# Patient Record
Sex: Male | Born: 1980 | Race: Black or African American | Hispanic: No | Marital: Married | Smoking: Former smoker
Health system: Southern US, Community
[De-identification: ages and names within clinical notes are randomized; demographics above are authoritative.]

## PROBLEM LIST (undated history)

## (undated) DIAGNOSIS — E538 Deficiency of other specified B group vitamins: Secondary | ICD-10-CM

## (undated) DIAGNOSIS — E559 Vitamin D deficiency, unspecified: Secondary | ICD-10-CM

## (undated) HISTORY — DX: Deficiency of other specified B group vitamins: E53.8

## (undated) HISTORY — DX: Vitamin D deficiency, unspecified: E55.9

---

## 2013-07-09 DIAGNOSIS — R7402 Elevation of levels of lactic acid dehydrogenase (LDH): Secondary | ICD-10-CM | POA: Insufficient documentation

## 2013-07-09 DIAGNOSIS — Z79899 Other long term (current) drug therapy: Secondary | ICD-10-CM | POA: Insufficient documentation

## 2013-07-09 DIAGNOSIS — R740 Nonspecific elevation of levels of transaminase and lactic acid dehydrogenase [LDH]: Secondary | ICD-10-CM

## 2013-07-09 DIAGNOSIS — R7401 Elevation of levels of liver transaminase levels: Secondary | ICD-10-CM | POA: Insufficient documentation

## 2013-07-09 DIAGNOSIS — Z880 Allergy status to penicillin: Secondary | ICD-10-CM | POA: Insufficient documentation

## 2013-07-09 DIAGNOSIS — R1013 Epigastric pain: Secondary | ICD-10-CM | POA: Insufficient documentation

## 2013-07-09 DIAGNOSIS — Z87891 Personal history of nicotine dependence: Secondary | ICD-10-CM | POA: Insufficient documentation

## 2013-07-10 ENCOUNTER — Emergency Department (HOSPITAL_COMMUNITY): Payer: 59

## 2013-07-10 ENCOUNTER — Emergency Department (HOSPITAL_COMMUNITY)
Admission: EM | Admit: 2013-07-10 | Discharge: 2013-07-10 | Disposition: A | Discharge status: Home / Self Care | Payer: 59 | Attending: Emergency Medicine | Admitting: Emergency Medicine

## 2013-07-10 ENCOUNTER — Encounter: Payer: Self-pay | Admitting: Gastroenterology

## 2013-07-10 ENCOUNTER — Encounter (HOSPITAL_COMMUNITY): Payer: Self-pay | Admitting: Emergency Medicine

## 2013-07-10 DIAGNOSIS — R7401 Elevation of levels of liver transaminase levels: Secondary | ICD-10-CM

## 2013-07-10 DIAGNOSIS — R740 Nonspecific elevation of levels of transaminase and lactic acid dehydrogenase [LDH]: Secondary | ICD-10-CM

## 2013-07-10 DIAGNOSIS — R109 Unspecified abdominal pain: Secondary | ICD-10-CM

## 2013-07-10 LAB — BASIC METABOLIC PANEL
BUN: 21 mg/dL (ref 6–23)
CALCIUM: 9.9 mg/dL (ref 8.4–10.5)
CHLORIDE: 100 meq/L (ref 96–112)
CO2: 23 meq/L (ref 19–32)
Creatinine, Ser: 1.1 mg/dL (ref 0.50–1.35)
GFR calc non Af Amer: 87 mL/min — ABNORMAL LOW (ref >90)
Glucose, Bld: 95 mg/dL (ref 70–99)
Potassium: 4 mEq/L (ref 3.7–5.3)
Sodium: 139 mEq/L (ref 137–147)

## 2013-07-10 LAB — HEPATIC FUNCTION PANEL
ALK PHOS: 68 U/L (ref 39–117)
ALT: 57 U/L — AB (ref 0–53)
AST: 189 U/L — ABNORMAL HIGH (ref 0–37)
Albumin: 4.2 g/dL (ref 3.5–5.2)
TOTAL PROTEIN: 7.3 g/dL (ref 6.0–8.3)
Total Bilirubin: 0.5 mg/dL (ref 0.3–1.2)

## 2013-07-10 LAB — CBC
HEMATOCRIT: 40.7 % (ref 39.0–52.0)
Hemoglobin: 14.3 g/dL (ref 13.0–17.0)
MCH: 31.3 pg (ref 26.0–34.0)
MCHC: 35.1 g/dL (ref 30.0–36.0)
MCV: 89.1 fL (ref 78.0–100.0)
PLATELETS: 272 10*3/uL (ref 150–400)
RBC: 4.57 MIL/uL (ref 4.22–5.81)
RDW: 12.3 % (ref 11.5–15.5)
WBC: 7.6 10*3/uL (ref 4.0–10.5)

## 2013-07-10 LAB — I-STAT TROPONIN, ED: Troponin i, poc: 0.01 ng/mL (ref 0.00–0.08)

## 2013-07-10 LAB — LIPASE, BLOOD: LIPASE: 43 U/L (ref 11–59)

## 2013-07-10 MED ORDER — TRAMADOL HCL 50 MG PO TABS: 50.0000 mg | ORAL_TABLET | Freq: Four times a day (QID) | ORAL | Status: DC | PRN

## 2013-07-10 MED ORDER — GI COCKTAIL ~~LOC~~
30.0000 mL | Freq: Once | ORAL | Status: AC
  Administered 2013-07-10: 30 mL via ORAL
  Filled 2013-07-10: qty 30

## 2013-07-10 MED ORDER — PANTOPRAZOLE SODIUM 40 MG PO TBEC
40.0000 mg | DELAYED_RELEASE_TABLET | Freq: Once | ORAL | Status: AC
  Administered 2013-07-10: 40 mg via ORAL
  Filled 2013-07-10: qty 1

## 2013-07-10 MED ORDER — PANTOPRAZOLE SODIUM 40 MG PO TBEC: 40.0000 mg | DELAYED_RELEASE_TABLET | Freq: Every day | ORAL | Status: DC

## 2013-07-10 NOTE — ED Provider Notes (Signed)
CSN: [id number]     Arrival date & time 07/09/13  2355 History   First MD Initiated Contact with Patient 07/10/13 0204     Chief Complaint  Patient presents with  . Shortness of Breath     (Consider location/radiation/quality/duration/timing/severity/associated sxs/prior Treatment) Patient is a 33 y.o. male presenting with shortness of breath. The history is provided by the patient.  Shortness of Breath He had onset this evening of some epigastric discomfort which he described as a feeling like his insides were being twisted. This discomfort was moderate in intensity and rated at 5/10. It was worse with taking it breath and worse with laying flat. Nothing seemed to make it better. There is no associated nausea or vomiting. The discomfort did radiate through to his back, but not to the chest or shoulder. He denies fever, chills, sweats. He denies diarrhea. He is not had similar symptoms in the past. He has not taken anything to try and treat his pain.  History reviewed. No pertinent past medical history. History reviewed. No pertinent past surgical history. No family history on file. History  Substance Use Topics  . Smoking status: Former Games developer  . Smokeless tobacco: Not on file  . Alcohol Use: Yes    Review of Systems  Respiratory: Positive for shortness of breath.   All other systems reviewed and are negative.      Allergies  Penicillins  Home Medications   Current Outpatient Rx  Name  Route  Sig  Dispense  Refill  . glucosamine-chondroitin 500-400 MG tablet   Oral   Take 1 tablet by mouth daily.         . Multiple Vitamins-Minerals (MULTIVITAMIN WITH MINERALS) tablet   Oral   Take 1 tablet by mouth daily.          BP 132/77  Pulse 80  Temp(Src) 97.9 F (36.6 C) (Oral)  Resp 18  SpO2 99% Physical Exam  Nursing note and vitals reviewed.  33 year old male, resting comfortably and in no acute distress. Vital signs are normal. Oxygen saturation is 99%, which  is normal. Head is normocephalic and atraumatic. PERRLA, EOMI. Oropharynx is clear. Neck is nontender and supple without adenopathy or JVD. Back is nontender and there is no CVA tenderness. Lungs are clear without rales, wheezes, or rhonchi. Chest is nontender. Heart has regular rate and rhythm without murmur. Abdomen is soft, flat, with mild tenderness in the epigastrium and right upper cautery. There is a +/- Murphy sign. There are no masses or hepatosplenomegaly and peristalsis is normoactive. Extremities have no cyanosis or edema, full range of motion is present. Skin is warm and dry without rash. Neurologic: Mental status is normal, cranial nerves are intact, there are no motor or sensory deficits.  ED Course  Procedures (including critical care time) Labs Review Results for orders placed during the hospital encounter of 07/10/13  BASIC METABOLIC PANEL      Result Value Ref Range   Sodium 139  137 - 147 mEq/L   Potassium 4.0  3.7 - 5.3 mEq/L   Chloride 100  96 - 112 mEq/L   CO2 23  19 - 32 mEq/L   Glucose, Bld 95  70 - 99 mg/dL   BUN 21  6 - 23 mg/dL   Creatinine, Ser 4.09  0.50 - 1.35 mg/dL   Calcium 9.9  8.4 - 81.1 mg/dL   GFR calc non Af Amer 87 (*) >90 mL/min   GFR calc Af Amer >90  >  90 mL/min  CBC      Result Value Ref Range   WBC 7.6  4.0 - 10.5 K/uL   RBC 4.57  4.22 - 5.81 MIL/uL   Hemoglobin 14.3  13.0 - 17.0 g/dL   HCT 69.6  29.5 - 28.4 %   MCV 89.1  78.0 - 100.0 fL   MCH 31.3  26.0 - 34.0 pg   MCHC 35.1  30.0 - 36.0 g/dL   RDW 13.2  44.0 - 10.2 %   Platelets 272  150 - 400 K/uL  HEPATIC FUNCTION PANEL      Result Value Ref Range   Total Protein 7.3  6.0 - 8.3 g/dL   Albumin 4.2  3.5 - 5.2 g/dL   AST 725 (*) 0 - 37 U/L   ALT 57 (*) 0 - 53 U/L   Alkaline Phosphatase 68  39 - 117 U/L   Total Bilirubin 0.5  0.3 - 1.2 mg/dL   Bilirubin, Direct <3.6  0.0 - 0.3 mg/dL   Indirect Bilirubin NOT CALCULATED  0.3 - 0.9 mg/dL  LIPASE, BLOOD      Result Value Ref  Range   Lipase 43  11 - 59 U/L  I-STAT TROPOININ, ED      Result Value Ref Range   Troponin i, poc 0.01  0.00 - 0.08 ng/mL   Comment 3            Imaging Review US Abdomen Complete  07/10/2013   CLINICAL DATA:  Abdominal pain, shortness of breath and chest pain.  EXAM: ULTRASOUND ABDOMEN COMPLETE  COMPARISON:  None.  FINDINGS: Gallbladder:  No gallstones or wall thickening visualized. No sonographic Murphy sign noted.  Common bile duct:  Diameter: 0.4 cm  Liver:  No focal lesion identified. Within normal limits in parenchymal echogenicity.  IVC:  No abnormality visualized.  Pancreas:  Visualized portion unremarkable.  Spleen:  Size and appearance within normal limits.  Right Kidney:  Length: 10.3 cm. Echogenicity within normal limits. No mass or hydronephrosis visualized.  Left Kidney:  Length: 10.9 cm. Echogenicity within normal limits. No mass or hydronephrosis visualized.  Abdominal aorta:  No aneurysm visualized.  Other findings:  None.  IMPRESSION: Negative for gallstones.  Negative exam.   Electronically Signed   By: Drusilla Kanner M.D.   On: 07/10/2013 03:33     EKG Interpretation   Date/Time:  Thursday July 10 2013 00:11:30 EDT Ventricular Rate:  79 PR Interval:  160 QRS Duration: 90 QT Interval:  346 QTC Calculation: 396 R Axis:   61 Text Interpretation:  Normal sinus rhythm with sinus arrhythmia Normal ECG  No old tracing to compare Confirmed by [facility]  MD, Dominique Calvey ([id number]) on  07/10/2013 1:50:16 AM      MDM   Final diagnoses:  Abdominal pain  Elevated transaminase level    Abdominal pain which may be gastritis it may be biliary colic. Will be sent for ultrasound and will be given a GI cocktail and a dose of pantoprazole. He has no past medical records at this institution.  He feels significantly better following above-noted treatment, although there is still some persistent pain. Ultrasound is unremarkable. Laboratory workup is significant for elevated transaminases. He  is insistent that he has had very little alcohol. He states that he had one shot of liquor yesterday, and has gone through half of a fifth over the last 2 weeks. In light of elevated transaminases, he is advised to abstain from alcohol and also abstain from  acetaminophen. He is discharged with prescriptions for pantoprazole and tramadol and is referred to gastroenterology for followup.  Dione Boozeavid Burwell Bethel, MD 07/10/13 [phone]

## 2013-07-10 NOTE — ED Notes (Signed)
Glick, MD at bedside.  

## 2013-07-10 NOTE — ED Notes (Addendum)
Having sob for an ~ 1hr.did have lt. Arm numbness and lower back pain.  Pt. Did work out today. Having chest tightness. Did some benching pressing. More pain with expiration. Pain is easing off.

## 2013-07-10 NOTE — Discharge Instructions (Signed)
Your blood tests showed some elevation in 2 liver enzymes - ALT and AST. These levels should be rechecked in one to 2 weeks to make sure that you're not getting worse. Please make a followup appointment with the gastroenterology office to have this checked. In the meantime, do not take anything with acetaminophen in it because it can harm the liver. Also, do not drink any alcohol.   Abdominal Pain, Adult Many things can cause abdominal pain. Usually, abdominal pain is not caused by a disease and will improve without treatment. It can often be observed and treated at home. Your health care provider will do a physical exam and possibly order blood tests and X-rays to help determine the seriousness of your pain. However, in many cases, more time must pass before a clear cause of the pain can be found. Before that point, your health care provider may not know if you need more testing or further treatment. HOME CARE INSTRUCTIONS  Monitor your abdominal pain for any changes. The following actions may help to alleviate any discomfort you are experiencing:  Only take over-the-counter or prescription medicines as directed by your health care provider.  Do not take laxatives unless directed to do so by your health care provider.  Try a clear liquid diet (broth, tea, or water) as directed by your health care provider. Slowly move to a bland diet as tolerated. SEEK MEDICAL CARE IF:  You have unexplained abdominal pain.  You have abdominal pain associated with nausea or diarrhea.  You have pain when you urinate or have a bowel movement.  You experience abdominal pain that wakes you in the night.  You have abdominal pain that is worsened or improved by eating food.  You have abdominal pain that is worsened with eating fatty foods. SEEK IMMEDIATE MEDICAL CARE IF:   Your pain does not go away within 2 hours.  You have a fever.  You keep throwing up (vomiting).  Your pain is felt only in portions of  the abdomen, such as the right side or the left lower portion of the abdomen.  You pass bloody or black tarry stools. MAKE SURE YOU:  Understand these instructions.   Will watch your condition.   Will get help right away if you are not doing well or get worse.  Document Released: 01/18/2005 Document Revised: 01/29/2013 Document Reviewed: 12/18/2012 [facility] Patient Information 2014 [place], [place].  Pantoprazole tablets What is this medicine? PANTOPRAZOLE (pan TOE pra zole) prevents the production of acid in the stomach. It is used to treat gastroesophageal reflux disease (GERD), inflammation of the esophagus, and Zollinger-Ellison syndrome. This medicine may be used for other purposes; ask your health care provider or pharmacist if you have questions. COMMON BRAND NAME(S): Protonix What should I tell my health care provider before I take this medicine? They need to know if you have any of these conditions: -liver disease -low levels of magnesium in the blood -an unusual or allergic reaction to omeprazole, lansoprazole, pantoprazole, rabeprazole, other medicines, foods, dyes, or preservatives -pregnant or trying to get pregnant -breast-feeding How should I use this medicine? Take this medicine by mouth. Swallow the tablets whole with a drink of water. Follow the directions on the prescription label. Do not crush, break, or chew. Take your medicine at regular intervals. Do not take your medicine more often than directed. Talk to your pediatrician regarding the use of this medicine in children. While this drug may be prescribed for children as young as 5 years  for selected conditions, precautions do apply. Overdosage: If you think you have taken too much of this medicine contact a poison control center or emergency room at once. NOTE: This medicine is only for you. Do not share this medicine with others. What if I miss a dose? If you miss a dose, take it as soon as you can. If it is  almost time for your next dose, take only that dose. Do not take double or extra doses. What may interact with this medicine? Do not take this medicine with any of the following medications: -atazanavir -nelfinavir This medicine may also interact with the following medications: -ampicillin -delavirdine -digoxin -diuretics -iron salts -medicines for fungal infections like ketoconazole, itraconazole and voriconazole -warfarin This list may not describe all possible interactions. Give your health care provider a list of all the medicines, herbs, non-prescription drugs, or dietary supplements you use. Also tell them if you smoke, drink alcohol, or use illegal drugs. Some items may interact with your medicine. What should I watch for while using this medicine? It can take several days before your stomach pain gets better. Check with your doctor or health care professional if your condition does not start to get better, or if it gets worse. You may need blood work done while you are taking this medicine. What side effects may I notice from receiving this medicine? Side effects that you should report to your doctor or health care professional as soon as possible: -allergic reactions like skin rash, itching or hives, swelling of the face, lips, or tongue -bone, muscle or joint pain -breathing problems -chest pain or chest tightness -dark yellow or brown urine -dizziness -fast, irregular heartbeat -feeling faint or lightheaded -fever or sore throat -muscle spasm -palpitations -redness, blistering, peeling or loosening of the skin, including inside the mouth -seizures -tremors -unusual bleeding or bruising -unusually weak or tired -yellowing of the eyes or skin Side effects that usually do not require medical attention (Report these to your doctor or health care professional if they continue or are bothersome.): -constipation -diarrhea -dry mouth -headache -nausea This list may not  describe all possible side effects. Call your doctor for medical advice about side effects. You may report side effects to FDA at 1-800-FDA-1088. Where should I keep my medicine? Keep out of the reach of children. Store at room temperature between 15 and 30 degrees C (59 and 86 degrees F). Protect from light and moisture. Throw away any unused medicine after the expiration date. NOTE: This sheet is a summary. It may not cover all possible information. If you have questions about this medicine, talk to your doctor, pharmacist, or health care provider.  2014, Elsevier/Gold Standard. (2012-02-07 16:40:16)  Tramadol tablets What is this medicine? TRAMADOL (TRA ma dole) is a pain reliever. It is used to treat moderate to severe pain in adults. This medicine may be used for other purposes; ask your health care provider or pharmacist if you have questions. COMMON BRAND NAME(S): Ultram What should I tell my health care provider before I take this medicine? They need to know if you have any of these conditions: -brain tumor -depression -drug abuse or addiction -head injury -if you frequently drink alcohol containing drinks -kidney disease or trouble passing urine -liver disease -lung disease, asthma, or breathing problems -seizures or epilepsy -suicidal thoughts, plans, or attempt; a previous suicide attempt by you or a family member -an unusual or allergic reaction to tramadol, codeine, other medicines, foods, dyes, or preservatives -pregnant or trying  to get pregnant -breast-feeding How should I use this medicine? Take this medicine by mouth with a full glass of water. Follow the directions on the prescription label. If the medicine upsets your stomach, take it with food or milk. Do not take more medicine than you are told to take. Talk to your pediatrician regarding the use of this medicine in children. Special care may be needed. Overdosage: If you think you have taken too much of this  medicine contact a poison control center or emergency room at once. NOTE: This medicine is only for you. Do not share this medicine with others. What if I miss a dose? If you miss a dose, take it as soon as you can. If it is almost time for your next dose, take only that dose. Do not take double or extra doses. What may interact with this medicine? Do not take this medicine with any of the following medications: -MAOIs like Carbex, Eldepryl, Marplan, Nardil, and Parnate This medicine may also interact with the following medications: -alcohol or medicines that contain alcohol -antihistamines -benzodiazepines -bupropion -carbamazepine or oxcarbazepine -clozapine -cyclobenzaprine -digoxin -furazolidone -linezolid -medicines for depression, anxiety, or psychotic disturbances -medicines for migraine headache like almotriptan, eletriptan, frovatriptan, naratriptan, rizatriptan, sumatriptan, zolmitriptan -medicines for pain like pentazocine, buprenorphine, butorphanol, meperidine, nalbuphine, and propoxyphene -medicines for sleep -muscle relaxants -naltrexone -phenobarbital -phenothiazines like perphenazine, thioridazine, chlorpromazine, mesoridazine, fluphenazine, prochlorperazine, promazine, and trifluoperazine -procarbazine -warfarin This list may not describe all possible interactions. Give your health care provider a list of all the medicines, herbs, non-prescription drugs, or dietary supplements you use. Also tell them if you smoke, drink alcohol, or use illegal drugs. Some items may interact with your medicine. What should I watch for while using this medicine? Tell your doctor or health care professional if your pain does not go away, if it gets worse, or if you have new or a different type of pain. You may develop tolerance to the medicine. Tolerance means that you will need a higher dose of the medicine for pain relief. Tolerance is normal and is expected if you take this medicine  for a long time. Do not suddenly stop taking your medicine because you may develop a severe reaction. Your body becomes used to the medicine. This does NOT mean you are addicted. Addiction is a behavior related to getting and using a drug for a non-medical reason. If you have pain, you have a medical reason to take pain medicine. Your doctor will tell you how much medicine to take. If your doctor wants you to stop the medicine, the dose will be slowly lowered over time to avoid any side effects. You may get drowsy or dizzy. Do not drive, use machinery, or do anything that needs mental alertness until you know how this medicine affects you. Do not stand or sit up quickly, especially if you are an older patient. This reduces the risk of dizzy or fainting spells. Alcohol can increase or decrease the effects of this medicine. Avoid alcoholic drinks. You may have constipation. Try to have a bowel movement at least every 2 to 3 days. If you do not have a bowel movement for 3 days, call your doctor or health care professional. Your mouth may get dry. Chewing sugarless gum or sucking hard candy, and drinking plenty of water may help. Contact your doctor if the problem does not go away or is severe. What side effects may I notice from receiving this medicine? Side effects that you should report to  your doctor or health care professional as soon as possible: -allergic reactions like skin rash, itching or hives, swelling of the face, lips, or tongue -breathing difficulties, wheezing -confusion -itching -light headedness or fainting spells -redness, blistering, peeling or loosening of the skin, including inside the mouth -seizures Side effects that usually do not require medical attention (report to your doctor or health care professional if they continue or are bothersome): -constipation -dizziness -drowsiness -headache -nausea, vomiting This list may not describe all possible side effects. Call your doctor  for medical advice about side effects. You may report side effects to FDA at 1-800-FDA-1088. Where should I keep my medicine? Keep out of the reach of children. Store at room temperature between 15 and 30 degrees C (59 and 86 degrees F). Keep container tightly closed. Throw away any unused medicine after the expiration date. NOTE: This sheet is a summary. It may not cover all possible information. If you have questions about this medicine, talk to your doctor, pharmacist, or health care provider.  2014, Elsevier/Gold Standard. (2009-12-22 11:55:44)

## 2013-07-10 NOTE — ED Notes (Signed)
Pt states that he worked out today at yesterday at 5:30, and started feeling the pain around 11. Pt states that while he was working out, he did not notice

## 2013-08-27 ENCOUNTER — Ambulatory Visit: Payer: 59 | Admitting: Gastroenterology

## 2015-02-15 IMAGING — US US ABDOMEN COMPLETE
1 series · 14 of 25 positions shown · non-contrast
Comparison: None.

CLINICAL DATA: Abdominal pain, shortness of breath and chest pain.

EXAM:
ULTRASOUND ABDOMEN COMPLETE

[Series 1: us abdomen complete · 0.20mm/px · 14 of 106 slices shown]
[im 1/106]
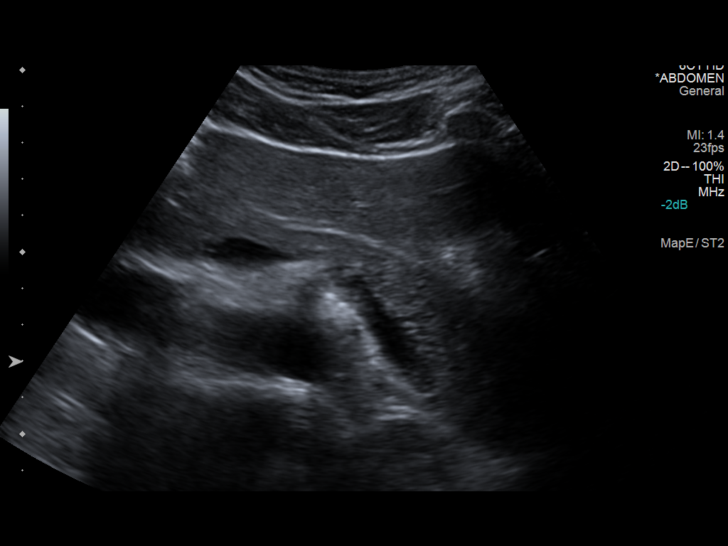
[im 9/106]
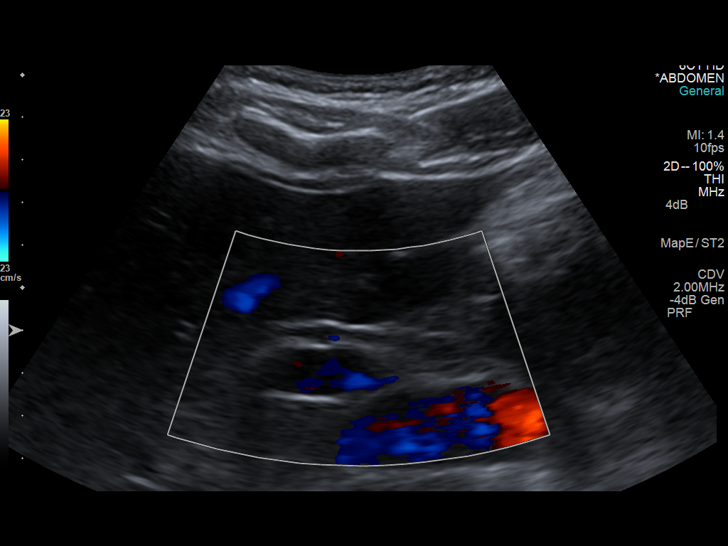
[im 18/106]
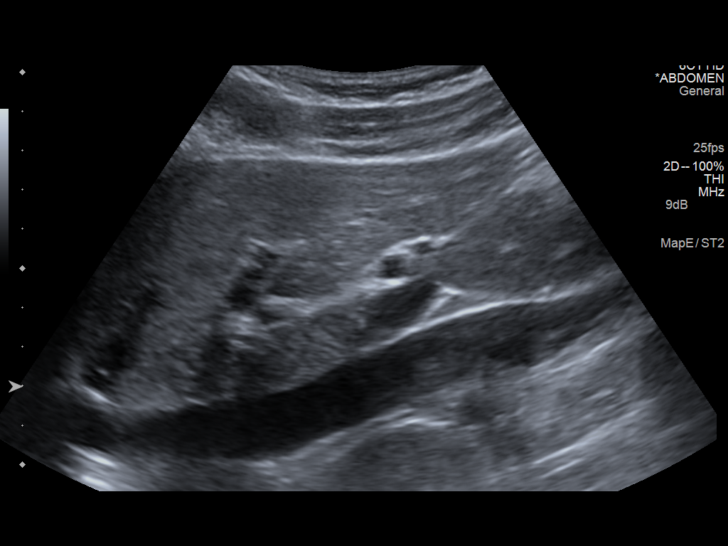
[im 27/106]
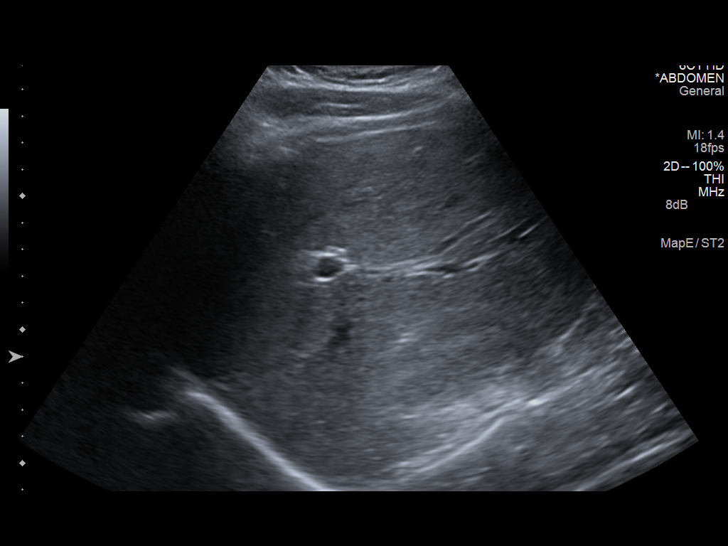
[im 36/106]
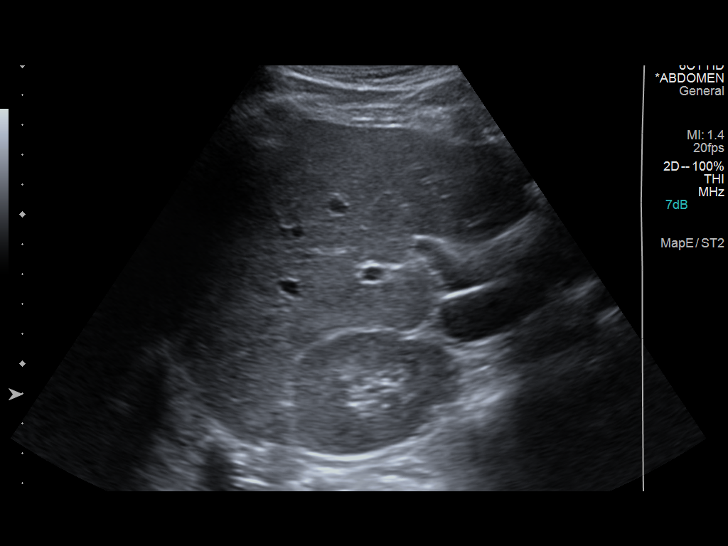
[im 40/106]
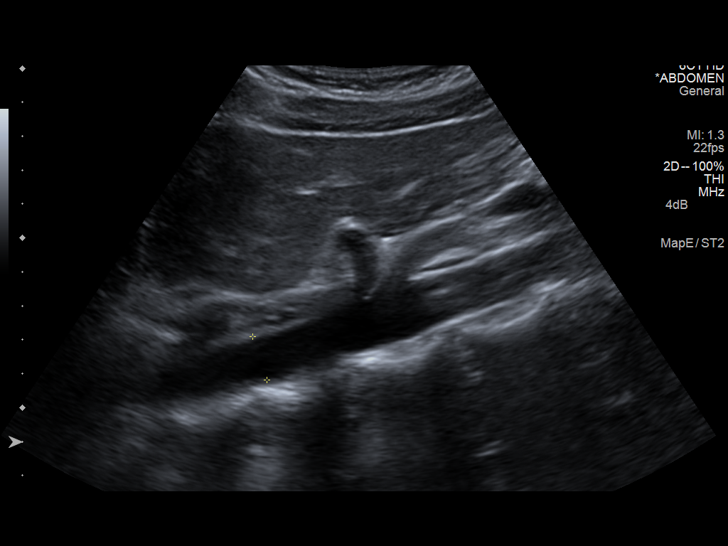
[im 49/106]
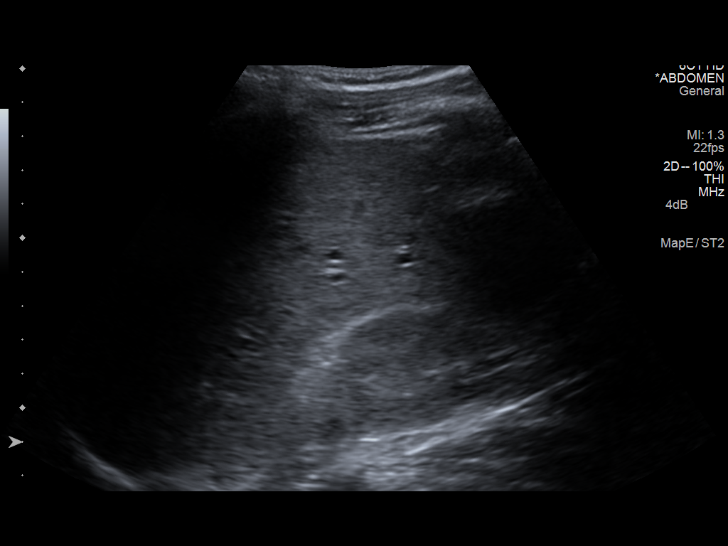
[im 57/106]
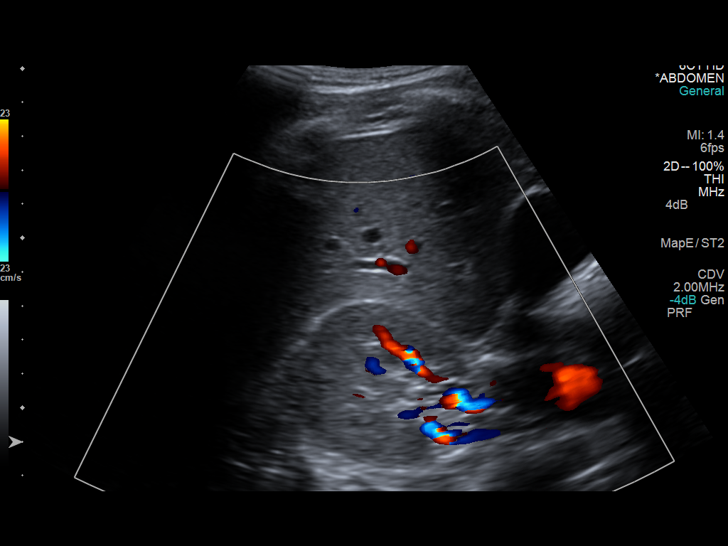
[im 66/106]
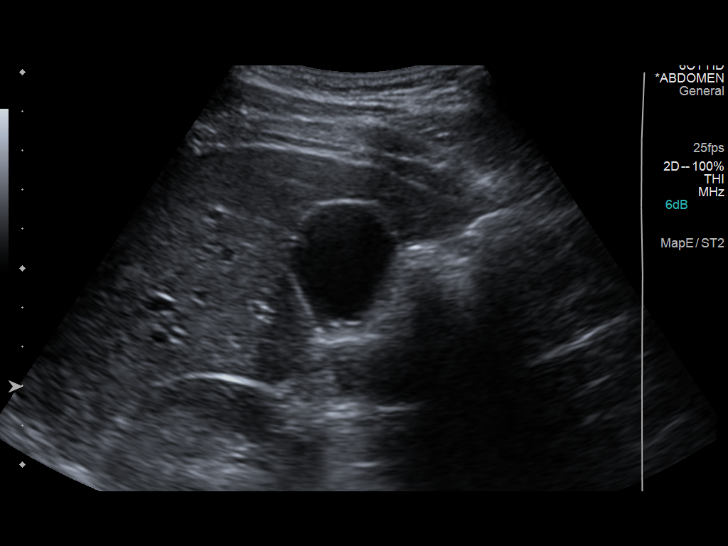
[im 71/106]
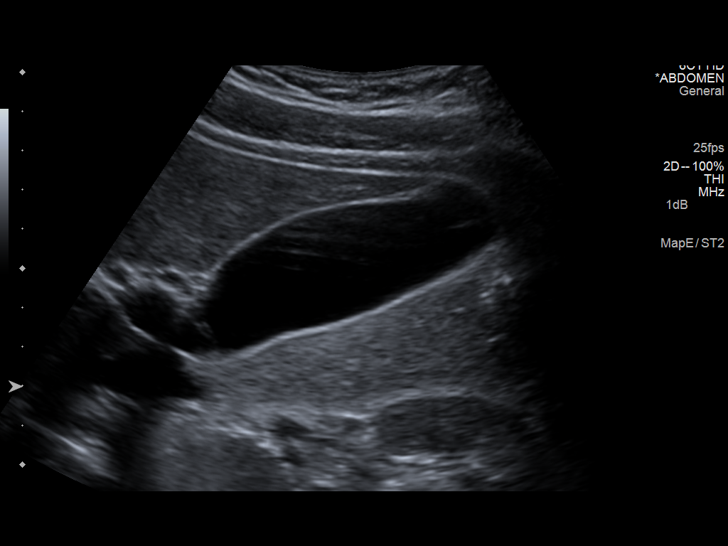
[im 79/106]
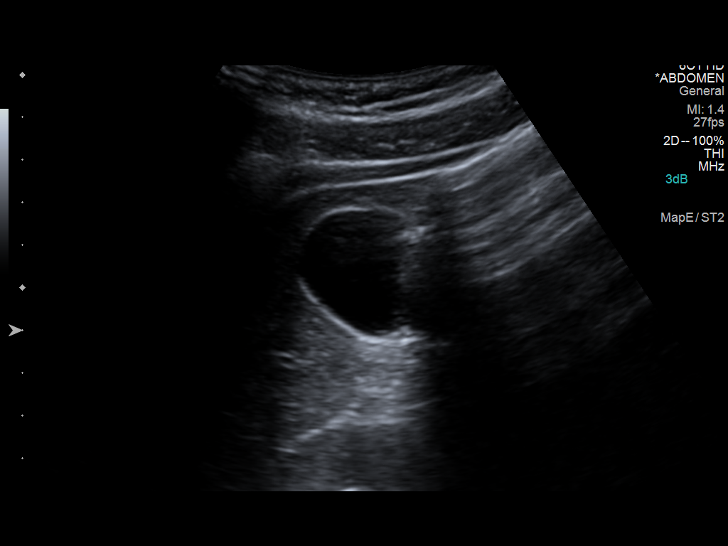
[im 88/106]
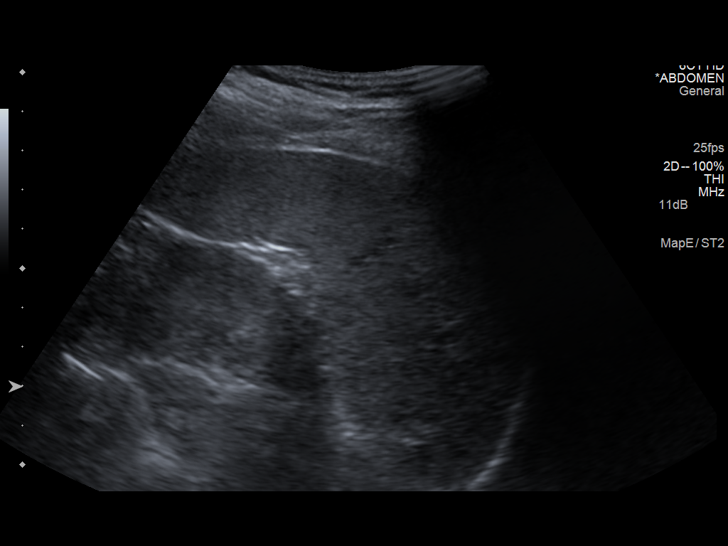
[im 97/106]
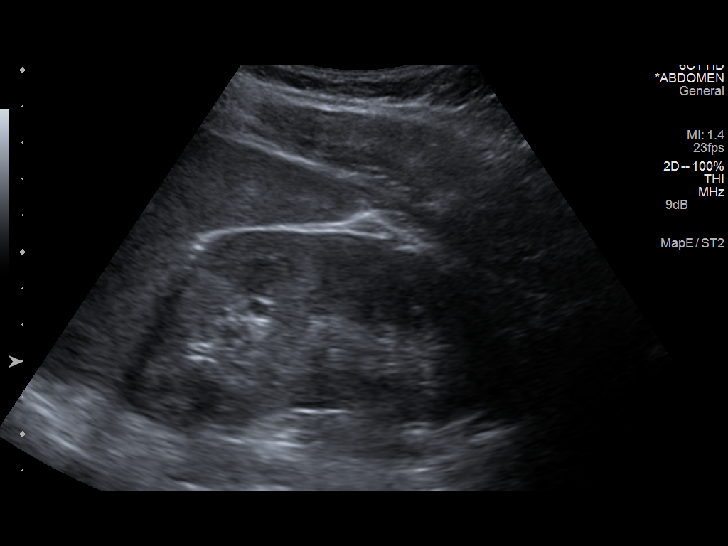
[im 106/106]
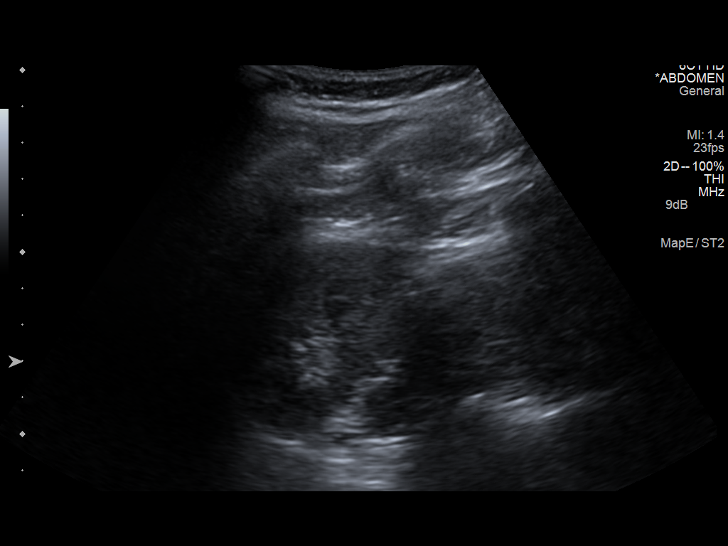

[14 of 25 positions shown; findings below may reference images not displayed]

FINDINGS: Gallbladder:

No gallstones or wall thickening visualized. No sonographic Murphy
sign noted.

Common bile duct:

Diameter: 0.4 cm

Liver:

No focal lesion identified. Within normal limits in parenchymal
echogenicity.

IVC:

No abnormality visualized.

Pancreas:

Visualized portion unremarkable.

Spleen:

Size and appearance within normal limits.

Right Kidney:

Length: 10.3 cm. Echogenicity within normal limits. No mass or
hydronephrosis visualized.

Left Kidney:

Length: 10.9 cm. Echogenicity within normal limits. No mass or
hydronephrosis visualized.

Abdominal aorta:

No aneurysm visualized.

Other findings:

None.
IMPRESSION: Negative for gallstones.  Negative exam.

## 2019-02-11 ENCOUNTER — Other Ambulatory Visit: Payer: Self-pay

## 2019-02-11 ENCOUNTER — Encounter: Payer: Self-pay | Admitting: Internal Medicine

## 2019-02-11 ENCOUNTER — Ambulatory Visit (INDEPENDENT_AMBULATORY_CARE_PROVIDER_SITE_OTHER): Payer: 59 | Admitting: Internal Medicine

## 2019-02-11 VITALS — BP 102/70 | HR 81 | Temp 98.5°F | Ht 71.0 in | Wt 195.9 lb

## 2019-02-11 DIAGNOSIS — E663 Overweight: Secondary | ICD-10-CM

## 2019-02-11 NOTE — Patient Instructions (Signed)
-  Nice seeing you today!!  -Schedule follow up for your physical at your convenience. Please come in fasting that day.  -Consider flu vaccination.

## 2019-02-11 NOTE — Progress Notes (Signed)
     Established Patient Office Visit     CC/Reason for Visit: Establish care, discuss chronic conditions  HPI: Caleb Trujillo is a 38 y.o. male who is coming in today for the above mentioned reasons.  He has no past medical history of significance, he works as a Clinical cytogeneticist for [employer], was intermittently.  He is married, has 2 young boys ages 57 and 88, he used to smoke marijuana but quit years ago, drinks alcohol occasionally.  He is adopted and hence does not know his family history.  He has no known drug allergies, no past surgical history.  Needs to establish care with a physician in the area.  He wants to schedule visit for CPE.  He is not fasting today.   Past Medical/Surgical History: History reviewed. No pertinent past medical history.  History reviewed. No pertinent surgical history.  Social History:  reports that he has quit smoking. He has never used smokeless tobacco. He reports current alcohol use. He reports that he does not use drugs.  Allergies: No Active Allergies  Family History:  Family History  Adopted: Yes    No current outpatient medications on file.  Review of Systems:  Constitutional: Denies fever, chills, diaphoresis, appetite change and fatigue.  HEENT: Denies photophobia, eye pain, redness, hearing loss, ear pain, congestion, sore throat, rhinorrhea, sneezing, mouth sores, trouble swallowing, neck pain, neck stiffness and tinnitus.   Respiratory: Denies SOB, DOE, cough, chest tightness,  and wheezing.   Cardiovascular: Denies chest pain, palpitations and leg swelling.  Gastrointestinal: Denies nausea, vomiting, abdominal pain, diarrhea, constipation, blood in stool and abdominal distention.  Genitourinary: Denies dysuria, urgency, frequency, hematuria, flank pain and difficulty urinating.  Endocrine: Denies: hot or cold intolerance, sweats, changes in hair or nails, polyuria, polydipsia. Musculoskeletal: Denies myalgias, back pain, joint swelling,  arthralgias and gait problem.  Skin: Denies pallor, rash and wound.  Neurological: Denies dizziness, seizures, syncope, weakness, light-headedness, numbness and headaches.  Hematological: Denies adenopathy. Easy bruising, personal or family bleeding history  Psychiatric/Behavioral: Denies suicidal ideation, mood changes, confusion, nervousness, sleep disturbance and agitation    Physical Exam: Vitals:   02/11/19 0831  BP: 102/70  Pulse: 81  Temp: 98.5 F (36.9 C)  TempSrc: Temporal  SpO2: 98%  Weight: 195 lb 14.4 oz (88.9 kg)  Height: 5\' 11"  (1.803 m)    Body mass index is 27.32 kg/m.   Constitutional: NAD, calm, comfortable Eyes: PERRL, lids and conjunctivae normal ENMT: Mucous membranes are moist.  Respiratory: clear to auscultation bilaterally, no wheezing, no crackles. Normal respiratory effort. No accessory muscle use.  Cardiovascular: Regular rate and rhythm, no murmurs / rubs / gallops. No extremity edema. 2+ pedal pulses.  Neurologic: Grossly intact and nonfocal Psychiatric: Normal judgment and insight. Alert and oriented x 3. Normal mood.    Impression and Plan:  Overweight (BMI 25.0-29.9) -Discussed healthy lifestyle, including increased physical activity and better food choices to promote weight loss and improve cardiovascular health.  We discussed flu vaccination, he will consider it.  He will schedule follow-up for CPE.    Patient Instructions  -Nice seeing you today!!  -Schedule follow up for your physical at your convenience. Please come in fasting that day.  -Consider flu vaccination.     Lelon Frohlich, MD [facility]

## 2019-03-12 ENCOUNTER — Ambulatory Visit: Payer: Self-pay | Admitting: *Deleted

## 2019-03-12 ENCOUNTER — Other Ambulatory Visit: Payer: Self-pay

## 2019-03-12 ENCOUNTER — Encounter: Payer: Self-pay | Admitting: Internal Medicine

## 2019-03-12 ENCOUNTER — Ambulatory Visit (INDEPENDENT_AMBULATORY_CARE_PROVIDER_SITE_OTHER): Payer: 59 | Admitting: Internal Medicine

## 2019-03-12 ENCOUNTER — Other Ambulatory Visit: Payer: Self-pay | Admitting: Internal Medicine

## 2019-03-12 VITALS — BP 110/70 | HR 61 | Temp 97.7°F | Ht 72.0 in | Wt 195.6 lb

## 2019-03-12 DIAGNOSIS — E663 Overweight: Secondary | ICD-10-CM | POA: Diagnosis not present

## 2019-03-12 DIAGNOSIS — E538 Deficiency of other specified B group vitamins: Secondary | ICD-10-CM | POA: Insufficient documentation

## 2019-03-12 DIAGNOSIS — E559 Vitamin D deficiency, unspecified: Secondary | ICD-10-CM | POA: Insufficient documentation

## 2019-03-12 DIAGNOSIS — Z0000 Encounter for general adult medical examination without abnormal findings: Secondary | ICD-10-CM

## 2019-03-12 LAB — COMPREHENSIVE METABOLIC PANEL
ALT: 17 U/L (ref 0–53)
AST: 21 U/L (ref 0–37)
Albumin: 4.4 g/dL (ref 3.5–5.2)
Alkaline Phosphatase: 59 U/L (ref 39–117)
BUN: 11 mg/dL (ref 6–23)
CO2: 29 mEq/L (ref 19–32)
Calcium: 9.3 mg/dL (ref 8.4–10.5)
Chloride: 105 mEq/L (ref 96–112)
Creatinine, Ser: 1.09 mg/dL (ref 0.40–1.50)
GFR: 90.98 mL/min (ref >60.00)
Glucose, Bld: 82 mg/dL (ref 70–99)
Potassium: 4.1 mEq/L (ref 3.5–5.1)
Sodium: 141 mEq/L (ref 135–145)
Total Bilirubin: 0.7 mg/dL (ref 0.2–1.2)
Total Protein: 6.8 g/dL (ref 6.0–8.3)

## 2019-03-12 LAB — LIPID PANEL
Cholesterol: 158 mg/dL (ref 0–200)
HDL: 40.4 mg/dL (ref >39.00)
LDL Cholesterol: 103 mg/dL — ABNORMAL HIGH (ref 0–99)
NonHDL: 117.18
Total CHOL/HDL Ratio: 4
Triglycerides: 72 mg/dL (ref 0.0–149.0)
VLDL: 14.4 mg/dL (ref 0.0–40.0)

## 2019-03-12 LAB — CBC WITH DIFFERENTIAL/PLATELET
Basophils Absolute: 0 10*3/uL (ref 0.0–0.1)
Basophils Relative: 0.6 % (ref 0.0–3.0)
Eosinophils Absolute: 0.1 10*3/uL (ref 0.0–0.7)
Eosinophils Relative: 2.7 % (ref 0.0–5.0)
HCT: 39.8 % (ref 39.0–52.0)
Hemoglobin: 13.6 g/dL (ref 13.0–17.0)
Lymphocytes Relative: 54.2 % — ABNORMAL HIGH (ref 12.0–46.0)
Lymphs Abs: 2.2 10*3/uL (ref 0.7–4.0)
MCHC: 34.2 g/dL (ref 30.0–36.0)
MCV: 93.1 fl (ref 78.0–100.0)
Monocytes Absolute: 0.3 10*3/uL (ref 0.1–1.0)
Monocytes Relative: 7.5 % (ref 3.0–12.0)
Neutro Abs: 1.4 10*3/uL (ref 1.4–7.7)
Neutrophils Relative %: 35 % — ABNORMAL LOW (ref 43.0–77.0)
Platelets: 215 10*3/uL (ref 150.0–400.0)
RBC: 4.28 Mil/uL (ref 4.22–5.81)
RDW: 12.3 % (ref 11.5–15.5)
WBC: 4 10*3/uL (ref 4.0–10.5)

## 2019-03-12 LAB — VITAMIN B12: Vitamin B-12: 129 pg/mL — ABNORMAL LOW (ref 211–911)

## 2019-03-12 LAB — VITAMIN D 25 HYDROXY (VIT D DEFICIENCY, FRACTURES): VITD: 32.16 ng/mL (ref 30.00–100.00)

## 2019-03-12 LAB — TSH: TSH: 1.77 u[IU]/mL (ref 0.35–4.50)

## 2019-03-12 MED ORDER — VITAMIN D (ERGOCALCIFEROL) 1.25 MG (50000 UNIT) PO CAPS: 50000.0000 [IU] | ORAL_CAPSULE | ORAL | 0 refills | Status: AC

## 2019-03-12 NOTE — Patient Instructions (Signed)
-Nice seeing you today!!  -Lab work today; will notify you once results are available.  -See you next year or sooner as needed.   Preventive Care 76-39 Years Old, Male Preventive care refers to lifestyle choices and visits with your health care provider that can promote health and wellness. This includes:  A yearly physical exam. This is also called an annual well check.  Regular dental and eye exams.  Immunizations.  Screening for certain conditions.  Healthy lifestyle choices, such as eating a healthy diet, getting regular exercise, not using drugs or products that contain nicotine and tobacco, and limiting alcohol use. What can I expect for my preventive care visit? Physical exam Your health care provider will check:  Height and weight. These may be used to calculate body mass index (BMI), which is a measurement that tells if you are at a healthy weight.  Heart rate and blood pressure.  Your skin for abnormal spots. Counseling Your health care provider may ask you questions about:  Alcohol, tobacco, and drug use.  Emotional well-being.  Home and relationship well-being.  Sexual activity.  Eating habits.  Work and work Statistician. What immunizations do I need?  Influenza (flu) vaccine  This is recommended every year. Tetanus, diphtheria, and pertussis (Tdap) vaccine  You may need a Td booster every 10 years. Varicella (chickenpox) vaccine  You may need this vaccine if you have not already been vaccinated. Human papillomavirus (HPV) vaccine  If recommended by your health care provider, you may need three doses over 6 months. Measles, mumps, and rubella (MMR) vaccine  You may need at least one dose of MMR. You may also need a second dose. Meningococcal conjugate (MenACWY) vaccine  One dose is recommended if you are 69-36 years old and a Market researcher living in a residence hall, or if you have one of several medical conditions. You may also  need additional booster doses. Pneumococcal conjugate (PCV13) vaccine  You may need this if you have certain conditions and were not previously vaccinated. Pneumococcal polysaccharide (PPSV23) vaccine  You may need one or two doses if you smoke cigarettes or if you have certain conditions. Hepatitis A vaccine  You may need this if you have certain conditions or if you travel or work in places where you may be exposed to hepatitis A. Hepatitis B vaccine  You may need this if you have certain conditions or if you travel or work in places where you may be exposed to hepatitis B. Haemophilus influenzae type b (Hib) vaccine  You may need this if you have certain risk factors. You may receive vaccines as individual doses or as more than one vaccine together in one shot (combination vaccines). Talk with your health care provider about the risks and benefits of combination vaccines. What tests do I need? Blood tests  Lipid and cholesterol levels. These may be checked every 5 years starting at age 65.  Hepatitis C test.  Hepatitis B test. Screening   Diabetes screening. This is done by checking your blood sugar (glucose) after you have not eaten for a while (fasting).  Sexually transmitted disease (STD) testing. Talk with your health care provider about your test results, treatment options, and if necessary, the need for more tests. Follow these instructions at home: Eating and drinking   Eat a diet that includes fresh fruits and vegetables, whole grains, lean protein, and low-fat dairy products.  Take vitamin and mineral supplements as recommended by your health care provider.  Do  not drink alcohol if your health care provider tells you not to drink.  If you drink alcohol: ? Limit how much you have to 0-2 drinks a day. ? Be aware of how much alcohol is in your drink. In the [place] one drink equals one 12 oz bottle of beer (355 mL), one 5 oz glass of wine (148 mL), or one 1 oz  glass of hard liquor (44 mL). Lifestyle  Take daily care of your teeth and gums.  Stay active. Exercise for at least 30 minutes on 5 or more days each week.  Do not use any products that contain nicotine or tobacco, such as cigarettes, e-cigarettes, and chewing tobacco. If you need help quitting, ask your health care provider.  If you are sexually active, practice safe sex. Use a condom or other form of protection to prevent STIs (sexually transmitted infections). What's next?  Go to your health care provider once a year for a well check visit.  Ask your health care provider how often you should have your eyes and teeth checked.  Stay up to date on all vaccines. This information is not intended to replace advice given to you by your health care provider. Make sure you discuss any questions you have with your health care provider. Document Released: 06/06/2001 Document Revised: 04/04/2018 Document Reviewed: 04/04/2018 Elsevier Patient Education  2020 [employer].

## 2019-03-12 NOTE — Progress Notes (Signed)
Established Patient Office Visit     CC/Reason for Visit: Annual preventive exam  HPI: Caleb Trujillo is a 38 y.o. male who is coming in today for the above mentioned reasons.  He has no past medical history of significance.  He has routine eye and dental care.  He declines flu shot today, he has no acute complaints.  He is a never smoker, he is adopted so does not know his family history.  Past Medical/Surgical History: No past medical history on file.  No past surgical history on file.  Social History:  reports that he has quit smoking. He has never used smokeless tobacco. He reports current alcohol use. He reports that he does not use drugs.  Allergies: No Active Allergies  Family History:  Family History  Adopted: Yes    No current outpatient medications on file.  Review of Systems:  Constitutional: Denies fever, chills, diaphoresis, appetite change and fatigue.  HEENT: Denies photophobia, eye pain, redness, hearing loss, ear pain, congestion, sore throat, rhinorrhea, sneezing, mouth sores, trouble swallowing, neck pain, neck stiffness and tinnitus.   Respiratory: Denies SOB, DOE, cough, chest tightness,  and wheezing.   Cardiovascular: Denies chest pain, palpitations and leg swelling.  Gastrointestinal: Denies nausea, vomiting, abdominal pain, diarrhea, constipation, blood in stool and abdominal distention.  Genitourinary: Denies dysuria, urgency, frequency, hematuria, flank pain and difficulty urinating.  Endocrine: Denies: hot or cold intolerance, sweats, changes in hair or nails, polyuria, polydipsia. Musculoskeletal: Denies myalgias, back pain, joint swelling, arthralgias and gait problem.  Skin: Denies pallor, rash and wound.  Neurological: Denies dizziness, seizures, syncope, weakness, light-headedness, numbness and headaches.  Hematological: Denies adenopathy. Easy bruising, personal or family bleeding history  Psychiatric/Behavioral: Denies suicidal  ideation, mood changes, confusion, nervousness, sleep disturbance and agitation    Physical Exam: Vitals:   03/12/19 0658  BP: 110/70  Pulse: 61  Temp: 97.7 F (36.5 C)  TempSrc: Temporal  SpO2: 98%  Weight: 195 lb 9.6 oz (88.7 kg)  Height: 6' (1.829 m)    Body mass index is 26.53 kg/m.   Constitutional: NAD, calm, comfortable Eyes: PERRL, lids and conjunctivae normal ENMT: Mucous membranes are moist.  Tympanic membrane is pearly white, no erythema or bulging. Neck: normal, supple, no masses, no thyromegaly Respiratory: clear to auscultation bilaterally, no wheezing, no crackles. Normal respiratory effort. No accessory muscle use.  Cardiovascular: Regular rate and rhythm, no murmurs / rubs / gallops. No extremity edema. 2+ pedal pulses. No carotid bruits.  Abdomen: no tenderness, no masses palpated. No hepatosplenomegaly. Bowel sounds positive.  Musculoskeletal: no clubbing / cyanosis. No joint deformity upper and lower extremities. Good ROM, no contractures. Normal muscle tone.  Skin: no rashes, lesions, ulcers. No induration Neurologic: CN 2-12 grossly intact. Sensation intact, DTR normal. Strength 5/5 in all 4.  Psychiatric: Normal judgment and insight. Alert and oriented x 3. Normal mood.    Impression and Plan:  Encounter for preventive health examination  -He has routine eye and dental care. -Despite extensive counseling, he has declined flu vaccination today, otherwise vaccinations are up-to-date. -Commence routine colon cancer screening at age 57. -Commence routine prostate cancer screening at age 29. -Healthy lifestyle has been discussed in detail today. -Screening labs performed today with results pending.  Overweight (BMI 25.0-29.9) -Discussed healthy lifestyle, including increased physical activity and better food choices to promote weight loss.     Patient Instructions  -Nice seeing you today!!  -Lab work today; will notify you once results are  available.  -See you next year or sooner as needed.   Preventive Care 55-79 Years Old, Male Preventive care refers to lifestyle choices and visits with your health care provider that can promote health and wellness. This includes:  A yearly physical exam. This is also called an annual well check.  Regular dental and eye exams.  Immunizations.  Screening for certain conditions.  Healthy lifestyle choices, such as eating a healthy diet, getting regular exercise, not using drugs or products that contain nicotine and tobacco, and limiting alcohol use. What can I expect for my preventive care visit? Physical exam Your health care provider will check:  Height and weight. These may be used to calculate body mass index (BMI), which is a measurement that tells if you are at a healthy weight.  Heart rate and blood pressure.  Your skin for abnormal spots. Counseling Your health care provider may ask you questions about:  Alcohol, tobacco, and drug use.  Emotional well-being.  Home and relationship well-being.  Sexual activity.  Eating habits.  Work and work Statistician. What immunizations do I need?  Influenza (flu) vaccine  This is recommended every year. Tetanus, diphtheria, and pertussis (Tdap) vaccine  You may need a Td booster every 10 years. Varicella (chickenpox) vaccine  You may need this vaccine if you have not already been vaccinated. Human papillomavirus (HPV) vaccine  If recommended by your health care provider, you may need three doses over 6 months. Measles, mumps, and rubella (MMR) vaccine  You may need at least one dose of MMR. You may also need a second dose. Meningococcal conjugate (MenACWY) vaccine  One dose is recommended if you are 20-75 years old and a Market researcher living in a residence hall, or if you have one of several medical conditions. You may also need additional booster doses. Pneumococcal conjugate (PCV13) vaccine  You  may need this if you have certain conditions and were not previously vaccinated. Pneumococcal polysaccharide (PPSV23) vaccine  You may need one or two doses if you smoke cigarettes or if you have certain conditions. Hepatitis A vaccine  You may need this if you have certain conditions or if you travel or work in places where you may be exposed to hepatitis A. Hepatitis B vaccine  You may need this if you have certain conditions or if you travel or work in places where you may be exposed to hepatitis B. Haemophilus influenzae type b (Hib) vaccine  You may need this if you have certain risk factors. You may receive vaccines as individual doses or as more than one vaccine together in one shot (combination vaccines). Talk with your health care provider about the risks and benefits of combination vaccines. What tests do I need? Blood tests  Lipid and cholesterol levels. These may be checked every 5 years starting at age 37.  Hepatitis C test.  Hepatitis B test. Screening   Diabetes screening. This is done by checking your blood sugar (glucose) after you have not eaten for a while (fasting).  Sexually transmitted disease (STD) testing. Talk with your health care provider about your test results, treatment options, and if necessary, the need for more tests. Follow these instructions at home: Eating and drinking   Eat a diet that includes fresh fruits and vegetables, whole grains, lean protein, and low-fat dairy products.  Take vitamin and mineral supplements as recommended by your health care provider.  Do not drink alcohol if your health care provider tells you not to drink.  If you drink alcohol: ? Limit how much you have to 0-2 drinks a day. ? Be aware of how much alcohol is in your drink. In the [place] one drink equals one 12 oz bottle of beer (355 mL), one 5 oz glass of wine (148 mL), or one 1 oz glass of hard liquor (44 mL). Lifestyle  Take daily care of your teeth and  gums.  Stay active. Exercise for at least 30 minutes on 5 or more days each week.  Do not use any products that contain nicotine or tobacco, such as cigarettes, e-cigarettes, and chewing tobacco. If you need help quitting, ask your health care provider.  If you are sexually active, practice safe sex. Use a condom or other form of protection to prevent STIs (sexually transmitted infections). What's next?  Go to your health care provider once a year for a well check visit.  Ask your health care provider how often you should have your eyes and teeth checked.  Stay up to date on all vaccines. This information is not intended to replace advice given to you by your health care provider. Make sure you discuss any questions you have with your health care provider. Document Released: 06/06/2001 Document Revised: 04/04/2018 Document Reviewed: 04/04/2018 Elsevier Patient Education  2020 [place], [place] [facility]

## 2019-03-12 NOTE — Telephone Encounter (Signed)
I returned his call and gave him the message from Dr. Isaac Bliss dated 03/12/2019 at 1:32 PM regarding his lab results.  He will go by the pharmacy and pick up his Rx for the Vitamin D.  He is wanting to know if there is any way he can come in in the afternoon after 3:30 to get his B12 injections due to his work.    I let him know someone would call him back and let him know.  He was agreeable to this.

## 2019-03-13 ENCOUNTER — Other Ambulatory Visit: Payer: Self-pay | Admitting: Internal Medicine

## 2019-03-13 ENCOUNTER — Telehealth: Payer: Self-pay | Admitting: *Deleted

## 2019-03-13 DIAGNOSIS — E559 Vitamin D deficiency, unspecified: Secondary | ICD-10-CM

## 2019-03-13 NOTE — Telephone Encounter (Signed)
Copied from [place] [phone] [phone]. Topic: General - Inquiry >> Mar 13, 2019  1:37 PM Alease Frame wrote: Reason for CRM: Patient confirming appt tomorrow [phone] will be at 3:30 instead of 12pm . Please reach out to patient  Spoke with patient and confirmed appointment is at 3:30 PM.

## 2019-03-13 NOTE — Telephone Encounter (Signed)
Spoke with patient and appointment scheduled 

## 2019-03-14 ENCOUNTER — Other Ambulatory Visit: Payer: Self-pay

## 2019-03-14 ENCOUNTER — Ambulatory Visit (INDEPENDENT_AMBULATORY_CARE_PROVIDER_SITE_OTHER): Payer: 59 | Admitting: *Deleted

## 2019-03-14 DIAGNOSIS — E538 Deficiency of other specified B group vitamins: Secondary | ICD-10-CM

## 2019-03-14 MED ORDER — CYANOCOBALAMIN 1000 MCG/ML IJ SOLN
1000.0000 ug | Freq: Once | INTRAMUSCULAR | Status: AC
  Administered 2019-03-14: 1000 ug via INTRAMUSCULAR

## 2019-03-21 ENCOUNTER — Encounter: Payer: Self-pay | Admitting: Internal Medicine

## 2019-03-26 NOTE — Telephone Encounter (Signed)
Please advise. Ok for rx? 

## 2019-03-26 NOTE — Progress Notes (Signed)
Cosign sent back to Dr. Jerilee Hoh per her request.

## 2019-03-28 ENCOUNTER — Ambulatory Visit: Payer: 59

## 2019-03-28 ENCOUNTER — Ambulatory Visit (INDEPENDENT_AMBULATORY_CARE_PROVIDER_SITE_OTHER): Payer: 59

## 2019-03-28 ENCOUNTER — Other Ambulatory Visit: Payer: Self-pay

## 2019-03-28 DIAGNOSIS — E538 Deficiency of other specified B group vitamins: Secondary | ICD-10-CM | POA: Diagnosis not present

## 2019-03-28 MED ORDER — CYANOCOBALAMIN 1000 MCG/ML IJ SOLN: 1000.0000 ug | Freq: Once | INTRAMUSCULAR | 8 refills | Status: AC

## 2019-03-28 MED ORDER — CYANOCOBALAMIN 1000 MCG/ML IJ SOLN
1000.0000 ug | Freq: Once | INTRAMUSCULAR | Status: AC
  Administered 2019-03-28: 1000 ug via INTRAMUSCULAR

## 2019-03-28 MED ORDER — "BD DISP NEEDLE 25G X 1"" MISC": 0 refills | Status: DC

## 2019-03-28 NOTE — Progress Notes (Signed)
Per orders of Dr. Hernandez, injection of B12 given by Mrytle Bento. Patient tolerated injection well.  

## 2019-04-04 ENCOUNTER — Ambulatory Visit (INDEPENDENT_AMBULATORY_CARE_PROVIDER_SITE_OTHER): Payer: 59 | Admitting: *Deleted

## 2019-04-04 ENCOUNTER — Other Ambulatory Visit: Payer: Self-pay

## 2019-04-04 DIAGNOSIS — E538 Deficiency of other specified B group vitamins: Secondary | ICD-10-CM | POA: Diagnosis not present

## 2019-04-04 MED ORDER — CYANOCOBALAMIN 1000 MCG/ML IJ SOLN
1000.0000 ug | Freq: Once | INTRAMUSCULAR | Status: AC
  Administered 2019-04-04: 1000 ug via INTRAMUSCULAR

## 2019-04-04 NOTE — Progress Notes (Signed)
Per orders of Dr. Hernandez, injection of Vitamin B 12 given by Maicee Ullman S Jeury Mcnab. Patient tolerated injection well. 

## 2019-05-01 ENCOUNTER — Other Ambulatory Visit: Payer: Self-pay

## 2019-05-02 ENCOUNTER — Ambulatory Visit: Payer: 59

## 2019-05-29 ENCOUNTER — Other Ambulatory Visit: Payer: Self-pay | Admitting: Internal Medicine

## 2019-05-29 DIAGNOSIS — E559 Vitamin D deficiency, unspecified: Secondary | ICD-10-CM

## 2019-06-16 ENCOUNTER — Other Ambulatory Visit: Payer: Self-pay

## 2019-06-17 ENCOUNTER — Other Ambulatory Visit (INDEPENDENT_AMBULATORY_CARE_PROVIDER_SITE_OTHER): Payer: 59

## 2019-06-17 DIAGNOSIS — E559 Vitamin D deficiency, unspecified: Secondary | ICD-10-CM

## 2019-06-18 LAB — VITAMIN D 25 HYDROXY (VIT D DEFICIENCY, FRACTURES): VITD: 30.32 ng/mL (ref 30.00–100.00)

## 2020-04-21 ENCOUNTER — Encounter: Payer: 59 | Admitting: Internal Medicine

## 2020-06-16 ENCOUNTER — Other Ambulatory Visit: Payer: Self-pay

## 2020-06-17 ENCOUNTER — Ambulatory Visit (INDEPENDENT_AMBULATORY_CARE_PROVIDER_SITE_OTHER): Payer: 59 | Admitting: Internal Medicine

## 2020-06-17 ENCOUNTER — Encounter: Payer: Self-pay | Admitting: Internal Medicine

## 2020-06-17 VITALS — BP 120/80 | HR 74 | Temp 98.6°F | Ht 72.0 in | Wt 205.1 lb

## 2020-06-17 DIAGNOSIS — M5441 Lumbago with sciatica, right side: Secondary | ICD-10-CM | POA: Diagnosis not present

## 2020-06-17 DIAGNOSIS — Z0000 Encounter for general adult medical examination without abnormal findings: Secondary | ICD-10-CM | POA: Diagnosis not present

## 2020-06-17 DIAGNOSIS — E559 Vitamin D deficiency, unspecified: Secondary | ICD-10-CM | POA: Diagnosis not present

## 2020-06-17 DIAGNOSIS — E538 Deficiency of other specified B group vitamins: Secondary | ICD-10-CM | POA: Diagnosis not present

## 2020-06-17 LAB — COMPREHENSIVE METABOLIC PANEL
ALT: 22 U/L (ref 0–53)
AST: 24 U/L (ref 0–37)
Albumin: 4.3 g/dL (ref 3.5–5.2)
Alkaline Phosphatase: 56 U/L (ref 39–117)
BUN: 10 mg/dL (ref 6–23)
CO2: 28 mEq/L (ref 19–32)
Calcium: 9.2 mg/dL (ref 8.4–10.5)
Chloride: 106 mEq/L (ref 96–112)
Creatinine, Ser: 1.11 mg/dL (ref 0.40–1.50)
GFR: 83.02 mL/min (ref >60.00)
Glucose, Bld: 82 mg/dL (ref 70–99)
Potassium: 4.7 mEq/L (ref 3.5–5.1)
Sodium: 140 mEq/L (ref 135–145)
Total Bilirubin: 0.6 mg/dL (ref 0.2–1.2)
Total Protein: 6.8 g/dL (ref 6.0–8.3)

## 2020-06-17 LAB — CBC WITH DIFFERENTIAL/PLATELET
Basophils Absolute: 0 10*3/uL (ref 0.0–0.1)
Basophils Relative: 0.8 % (ref 0.0–3.0)
Eosinophils Absolute: 0.1 10*3/uL (ref 0.0–0.7)
Eosinophils Relative: 1.8 % (ref 0.0–5.0)
HCT: 39.3 % (ref 39.0–52.0)
Hemoglobin: 13.5 g/dL (ref 13.0–17.0)
Lymphocytes Relative: 53.5 % — ABNORMAL HIGH (ref 12.0–46.0)
Lymphs Abs: 2.1 10*3/uL (ref 0.7–4.0)
MCHC: 34.3 g/dL (ref 30.0–36.0)
MCV: 93.3 fl (ref 78.0–100.0)
Monocytes Absolute: 0.3 10*3/uL (ref 0.1–1.0)
Monocytes Relative: 8.1 % (ref 3.0–12.0)
Neutro Abs: 1.4 10*3/uL (ref 1.4–7.7)
Neutrophils Relative %: 35.8 % — ABNORMAL LOW (ref 43.0–77.0)
Platelets: 221 10*3/uL (ref 150.0–400.0)
RBC: 4.21 Mil/uL — ABNORMAL LOW (ref 4.22–5.81)
RDW: 12.7 % (ref 11.5–15.5)
WBC: 3.9 10*3/uL — ABNORMAL LOW (ref 4.0–10.5)

## 2020-06-17 LAB — LIPID PANEL
Cholesterol: 138 mg/dL (ref 0–200)
HDL: 39 mg/dL — ABNORMAL LOW (ref >39.00)
LDL Cholesterol: 85 mg/dL (ref 0–99)
NonHDL: 98.96
Total CHOL/HDL Ratio: 4
Triglycerides: 69 mg/dL (ref 0.0–149.0)
VLDL: 13.8 mg/dL (ref 0.0–40.0)

## 2020-06-17 LAB — TSH: TSH: 1.4 u[IU]/mL (ref 0.35–4.50)

## 2020-06-17 LAB — VITAMIN D 25 HYDROXY (VIT D DEFICIENCY, FRACTURES): VITD: 31.73 ng/mL (ref 30.00–100.00)

## 2020-06-17 LAB — VITAMIN B12: Vitamin B-12: 177 pg/mL — ABNORMAL LOW (ref 211–911)

## 2020-06-17 LAB — HEMOGLOBIN A1C: Hgb A1c MFr Bld: 5.3 % (ref 4.6–6.5)

## 2020-06-17 NOTE — Progress Notes (Signed)
Established Patient Office Visit     This visit occurred during the SARS-CoV-2 public health emergency.  Safety protocols were in place, including screening questions prior to the visit, additional usage of staff PPE, and extensive cleaning of exam room while observing appropriate contact time as indicated for disinfecting solutions.    CC/Reason for Visit: Annual preventive exam  HPI: Caleb Trujillo is a 40 y.o. male who is coming in today for the above mentioned reasons. Past Medical History is significant for: Vitamin D and vitamin B12 deficiency.  He has routine eye and dental care.  He is due for his COVID booster and flu vaccine.  He has been having some right-sided lower back pain that radiates into his groin area but it is slowly improving with exercises at home.   Past Medical/Surgical History: No past medical history on file.  No past surgical history on file.  Social History:  reports that he has quit smoking. He has never used smokeless tobacco. He reports current alcohol use. He reports that he does not use drugs.  Allergies: No Active Allergies  Family History:  Family History  Adopted: Yes     Current Outpatient Medications:  .  NEEDLE, DISP, 25 G (B-D DISP NEEDLE 25GX1") 25G X 1" MISC, Use to inject 1,000 mcg of B12 once a week for 4 weeks and then once a month., Disp: 1 each, Rfl: 0  Review of Systems:  Constitutional: Denies fever, chills, diaphoresis, appetite change and fatigue.  HEENT: Denies photophobia, eye pain, redness, hearing loss, ear pain, congestion, sore throat, rhinorrhea, sneezing, mouth sores, trouble swallowing, neck pain, neck stiffness and tinnitus.   Respiratory: Denies SOB, DOE, cough, chest tightness,  and wheezing.   Cardiovascular: Denies chest pain, palpitations and leg swelling.  Gastrointestinal: Denies nausea, vomiting, abdominal pain, diarrhea, constipation, blood in stool and abdominal distention.  Genitourinary:  Denies dysuria, urgency, frequency, hematuria, flank pain and difficulty urinating.  Endocrine: Denies: hot or cold intolerance, sweats, changes in hair or nails, polyuria, polydipsia. Musculoskeletal: Denies myalgias, joint swelling, arthralgias and gait problem.  Skin: Denies pallor, rash and wound.  Neurological: Denies dizziness, seizures, syncope, weakness, light-headedness, numbness and headaches.  Hematological: Denies adenopathy. Easy bruising, personal or family bleeding history  Psychiatric/Behavioral: Denies suicidal ideation, mood changes, confusion, nervousness, sleep disturbance and agitation    Physical Exam: Vitals:   06/17/20 0833  BP: 120/80  Pulse: 74  Temp: 98.6 F (37 C)  TempSrc: Oral  SpO2: 98%  Weight: 205 lb 1.6 oz (93 kg)  Height: 6' (1.829 m)    Body mass index is 27.82 kg/m.   Constitutional: NAD, calm, comfortable Eyes: PERRL, lids and conjunctivae normal ENMT: Mucous membranes are moist. Posterior pharynx clear of any exudate or lesions. Normal dentition. Tympanic membrane is pearly white, no erythema or bulging. Neck: normal, supple, no masses, no thyromegaly Respiratory: clear to auscultation bilaterally, no wheezing, no crackles. Normal respiratory effort. No accessory muscle use.  Cardiovascular: Regular rate and rhythm, no murmurs / rubs / gallops. No extremity edema. 2+ pedal pulses.  Abdomen: no tenderness, no masses palpated. No hepatosplenomegaly. Bowel sounds positive.  Musculoskeletal: no clubbing / cyanosis. No joint deformity upper and lower extremities. Good ROM, no contractures. Normal muscle tone.  Skin: no rashes, lesions, ulcers. No induration Neurologic: CN 2-12 grossly intact. Sensation intact, DTR normal. Strength 5/5 in all 4.  Psychiatric: Normal judgment and insight. Alert and oriented x 3. Normal mood.    Impression and  Plan:  Encounter for preventive health examination  -He has routine eye and dental care. -He is due  for Covid booster and flu vaccine, declines flu vaccination today. -Healthy lifestyle discussed in detail. -Screening labs today. -Commence routine colon cancer screening age 67.  Vitamin D deficiency  - Plan: VITAMIN D 25 Hydroxy (Vit-D Deficiency, Fractures)  Vitamin B12 deficiency  - Plan: Vitamin B12  Acute right-sided low back pain with right-sided sciatica -Have advised back exercises, icing, as needed NSAIDs, if no improvement would consider physical therapy next.   Patient Instructions   -Nice seeing you today!!  -Lab work today; will notify you once results are available.  -Remember your COVID booster.  -Schedule follow up in 1 year or sooner as needed.   Preventive Care 16-15 Years Old, Male Preventive care refers to lifestyle choices and visits with your health care provider that can promote health and wellness. This includes:  A yearly physical exam. This is also called an annual wellness visit.  Regular dental and eye exams.  Immunizations.  Screening for certain conditions.  Healthy lifestyle choices, such as: ? Eating a healthy diet. ? Getting regular exercise. ? Not using drugs or products that contain nicotine and tobacco. ? Limiting alcohol use. What can I expect for my preventive care visit? Physical exam Your health care provider will check your:  Height and weight. These may be used to calculate your BMI (body mass index). BMI is a measurement that tells if you are at a healthy weight.  Heart rate and blood pressure.  Body temperature.  Skin for abnormal spots. Counseling Your health care provider may ask you questions about your:  Past medical problems.  Family's medical history.  Alcohol, tobacco, and drug use.  Emotional well-being.  Home life and relationship well-being.  Sexual activity.  Diet, exercise, and sleep habits.  Work and work Astronomer.  Access to firearms. What immunizations do I need? Vaccines are  usually given at various ages, according to a schedule. Your health care provider will recommend vaccines for you based on your age, medical history, and lifestyle or other factors, such as travel or where you work.   What tests do I need? Blood tests  Lipid and cholesterol levels. These may be checked every 5 years, or more often if you are over 20 years old.  Hepatitis C test.  Hepatitis B test. Screening  Lung cancer screening. You may have this screening every year starting at age 14 if you have a 30-pack-year history of smoking and currently smoke or have quit within the past 15 years.  Prostate cancer screening. Recommendations will vary depending on your family history and other risks.  Genital exam to check for testicular cancer or hernias.  Colorectal cancer screening. ? All adults should have this screening starting at age 68 and continuing until age 35. ? Your health care provider may recommend screening at age 35 if you are at increased risk. ? You will have tests every 1-10 years, depending on your results and the type of screening test.  Diabetes screening. ? This is done by checking your blood sugar (glucose) after you have not eaten for a while (fasting). ? You may have this done every 1-3 years.  STD (sexually transmitted disease) testing, if you are at risk. Follow these instructions at home: Eating and drinking  Eat a diet that includes fresh fruits and vegetables, whole grains, lean protein, and low-fat dairy products.  Take vitamin and mineral supplements as recommended  by your health care provider.  Do not drink alcohol if your health care provider tells you not to drink.  If you drink alcohol: ? Limit how much you have to 0-2 drinks a day. ? Be aware of how much alcohol is in your drink. In the [place] one drink equals one 12 oz bottle of beer (355 mL), one 5 oz glass of wine (148 mL), or one 1 oz glass of hard liquor (44 mL).   Lifestyle  Take daily  care of your teeth and gums. Brush your teeth every morning and night with fluoride toothpaste. Floss one time each day.  Stay active. Exercise for at least 30 minutes 5 or more days each week.  Do not use any products that contain nicotine or tobacco, such as cigarettes, e-cigarettes, and chewing tobacco. If you need help quitting, ask your health care provider.  Do not use drugs.  If you are sexually active, practice safe sex. Use a condom or other form of protection to prevent STIs (sexually transmitted infections).  If told by your health care provider, take low-dose aspirin daily starting at age 12.  Find healthy ways to cope with stress, such as: ? Meditation, yoga, or listening to music. ? Journaling. ? Talking to a trusted person. ? Spending time with friends and family. Safety  Always wear your seat belt while driving or riding in a vehicle.  Do not drive: ? If you have been drinking alcohol. Do not ride with someone who has been drinking. ? When you are tired or distracted. ? While texting.  Wear a helmet and other protective equipment during sports activities.  If you have firearms in your house, make sure you follow all gun safety procedures. What's next?  Go to your health care provider once a year for an annual wellness visit.  Ask your health care provider how often you should have your eyes and teeth checked.  Stay up to date on all vaccines. This information is not intended to replace advice given to you by your health care provider. Make sure you discuss any questions you have with your health care provider. Document Revised: 01/07/2019 Document Reviewed: 04/04/2018 Elsevier Patient Education  2021 Elsevier Inc.       Chaya Jan, MD [facility]

## 2020-06-17 NOTE — Patient Instructions (Signed)
-Nice seeing you today!!  -Lab work today; will notify you once results are available.  -Remember your COVID booster.  -Schedule follow up in 1 year or sooner as needed.   Preventive Care 74-41 Years Old, Male Preventive care refers to lifestyle choices and visits with your health care provider that can promote health and wellness. This includes:  A yearly physical exam. This is also called an annual wellness visit.  Regular dental and eye exams.  Immunizations.  Screening for certain conditions.  Healthy lifestyle choices, such as: ? Eating a healthy diet. ? Getting regular exercise. ? Not using drugs or products that contain nicotine and tobacco. ? Limiting alcohol use. What can I expect for my preventive care visit? Physical exam Your health care provider will check your:  Height and weight. These may be used to calculate your BMI (body mass index). BMI is a measurement that tells if you are at a healthy weight.  Heart rate and blood pressure.  Body temperature.  Skin for abnormal spots. Counseling Your health care provider may ask you questions about your:  Past medical problems.  Family's medical history.  Alcohol, tobacco, and drug use.  Emotional well-being.  Home life and relationship well-being.  Sexual activity.  Diet, exercise, and sleep habits.  Work and work Astronomer.  Access to firearms. What immunizations do I need? Vaccines are usually given at various ages, according to a schedule. Your health care provider will recommend vaccines for you based on your age, medical history, and lifestyle or other factors, such as travel or where you work.   What tests do I need? Blood tests  Lipid and cholesterol levels. These may be checked every 5 years, or more often if you are over 41 years old.  Hepatitis C test.  Hepatitis B test. Screening  Lung cancer screening. You may have this screening every year starting at age 43 if you have a  30-pack-year history of smoking and currently smoke or have quit within the past 15 years.  Prostate cancer screening. Recommendations will vary depending on your family history and other risks.  Genital exam to check for testicular cancer or hernias.  Colorectal cancer screening. ? All adults should have this screening starting at age 77 and continuing until age 28. ? Your health care provider may recommend screening at age 18 if you are at increased risk. ? You will have tests every 1-10 years, depending on your results and the type of screening test.  Diabetes screening. ? This is done by checking your blood sugar (glucose) after you have not eaten for a while (fasting). ? You may have this done every 1-3 years.  STD (sexually transmitted disease) testing, if you are at risk. Follow these instructions at home: Eating and drinking  Eat a diet that includes fresh fruits and vegetables, whole grains, lean protein, and low-fat dairy products.  Take vitamin and mineral supplements as recommended by your health care provider.  Do not drink alcohol if your health care provider tells you not to drink.  If you drink alcohol: ? Limit how much you have to 0-2 drinks a day. ? Be aware of how much alcohol is in your drink. In the [place] one drink equals one 12 oz bottle of beer (355 mL), one 5 oz glass of wine (148 mL), or one 1 oz glass of hard liquor (44 mL).   Lifestyle  Take daily care of your teeth and gums. Brush your teeth every morning and night  with fluoride toothpaste. Floss one time each day.  Stay active. Exercise for at least 30 minutes 5 or more days each week.  Do not use any products that contain nicotine or tobacco, such as cigarettes, e-cigarettes, and chewing tobacco. If you need help quitting, ask your health care provider.  Do not use drugs.  If you are sexually active, practice safe sex. Use a condom or other form of protection to prevent STIs (sexually transmitted  infections).  If told by your health care provider, take low-dose aspirin daily starting at age 36.  Find healthy ways to cope with stress, such as: ? Meditation, yoga, or listening to music. ? Journaling. ? Talking to a trusted person. ? Spending time with friends and family. Safety  Always wear your seat belt while driving or riding in a vehicle.  Do not drive: ? If you have been drinking alcohol. Do not ride with someone who has been drinking. ? When you are tired or distracted. ? While texting.  Wear a helmet and other protective equipment during sports activities.  If you have firearms in your house, make sure you follow all gun safety procedures. What's next?  Go to your health care provider once a year for an annual wellness visit.  Ask your health care provider how often you should have your eyes and teeth checked.  Stay up to date on all vaccines. This information is not intended to replace advice given to you by your health care provider. Make sure you discuss any questions you have with your health care provider. Document Revised: 01/07/2019 Document Reviewed: 04/04/2018 Elsevier Patient Education  2021 [employer].

## 2020-06-17 NOTE — Addendum Note (Signed)
Addended by: Lerry Liner on: 06/17/2020 09:10 AM   Modules accepted: Orders

## 2020-12-03 ENCOUNTER — Ambulatory Visit
Admission: EM | Admit: 2020-12-03 | Discharge: 2020-12-03 | Disposition: A | Discharge status: Home / Self Care | Payer: 59 | Attending: Emergency Medicine | Admitting: Emergency Medicine

## 2020-12-03 ENCOUNTER — Other Ambulatory Visit: Payer: Self-pay

## 2020-12-03 ENCOUNTER — Ambulatory Visit: Payer: Self-pay

## 2020-12-03 DIAGNOSIS — R109 Unspecified abdominal pain: Secondary | ICD-10-CM | POA: Diagnosis not present

## 2020-12-03 LAB — POCT URINALYSIS DIP (MANUAL ENTRY)
Bilirubin, UA: NEGATIVE
Blood, UA: NEGATIVE
Glucose, UA: NEGATIVE mg/dL
Ketones, POC UA: NEGATIVE mg/dL
Leukocytes, UA: NEGATIVE
Nitrite, UA: NEGATIVE
Protein Ur, POC: NEGATIVE mg/dL
Spec Grav, UA: 1.025 (ref 1.010–1.025)
Urobilinogen, UA: 0.2 E.U./dL
pH, UA: 6.5 (ref 5.0–8.0)

## 2020-12-03 MED ORDER — IBUPROFEN 600 MG PO TABS: 600.0000 mg | ORAL_TABLET | Freq: Four times a day (QID) | ORAL | 0 refills | Status: AC | PRN

## 2020-12-03 NOTE — Discharge Instructions (Addendum)
Please use ibuprofen with food every 8 hours, may pair with Tylenol  Please use Miralax for moderate to severe constipation. Take this (17g) once a day for the next 2-3 days. Please also start docusate stool softener, twice a day for at least 1 week. If stools become loose, cut down to once a day for another week. If stools remain loose, cut back to 1 pill every other day for a third week. You can stop docusate thereafter and resume as needed for constipation.  To help reduce constipation and promote bowel health: 1. Drink at least 64 ounces of water each day 2. Eat plenty of fiber (fruits, vegetables, whole grains, legumes) 3. Be physically active or exercise including walking, jogging, swimming, yoga, etc. 4. For active constipation use a stool softener (docusate) or an osmotic laxative (like Miralax) each day, or as needed.

## 2020-12-03 NOTE — ED Triage Notes (Signed)
Patient presents to Urgent Care with complaints of left sided abdominal pain x 2 days. Pt states he has noted increased pain with large meals pain or taking a deep breath. Pt states he has a hx of constipation. Last BM yesterday. Pt states he has some changes in activity doing pushups. He states "I think I it made be related to me jerking the other day." Treating symptoms with motrin and pepto.   Denies fever, n/v, diarrhea.

## 2020-12-03 NOTE — ED Provider Notes (Signed)
[facility]    CSN: [id number] Arrival date & time: 12/03/20  4098      History   Chief Complaint Chief Complaint  Patient presents with   Abdominal Pain    Left side    Appointment    0915    HPI Caleb Trujillo is a 40 y.o. male presenting today for evaluation of left side pain.  Reports 2 days of left abdominal/flank pain.  Symptoms minimal at rest, but worsened with deep inspiration twisting towards left side as well as after eating.  He denies any associated nausea or vomiting.  Denies radiation of symptoms into chest.  Denies shortness of breath.  Denies any recent cough, URI symptoms or fevers.  He denies radiation into groin, testicle.  Denies any urinary symptoms.  Denies history of stones.  Reports bowels have been slightly less frequent and slightly softer than normal.  Denies significant history of constipation.  HPI  History reviewed. No pertinent past medical history.  Patient Active Problem List   Diagnosis Date Noted   Vitamin D deficiency 03/12/2019   Vitamin B12 deficiency 03/12/2019    History reviewed. No pertinent surgical history.     Home Medications    Prior to Admission medications   Medication Sig Start Date End Date Taking? Authorizing Provider  ibuprofen (ADVIL) 600 MG tablet Take 1 tablet (600 mg total) by mouth every 6 (six) hours as needed. 12/03/20  Yes Romonia Yanik C, PA-C  NEEDLE, DISP, 25 G (B-D DISP NEEDLE 25GX1") 25G X 1" MISC Use to inject 1,000 mcg of B12 once a week for 4 weeks and then once a month. 03/28/19   Philip Aspen, Limmie Patricia, MD    Family History Family History  Adopted: Yes    Social History Social History   Tobacco Use   Smoking status: Former    Types: Cigars   Smokeless tobacco: Never  Vaping Use   Vaping Use: Never used  Substance Use Topics   Alcohol use: Yes    Comment: occasional   Drug use: Not Currently    Types: Marijuana     Allergies   Patient has no known  allergies.   Review of Systems Review of Systems  Constitutional:  Negative for fatigue and fever.  Eyes:  Negative for redness, itching and visual disturbance.  Respiratory:  Negative for shortness of breath.   Cardiovascular:  Negative for chest pain and leg swelling.  Gastrointestinal:  Negative for nausea and vomiting.  Genitourinary:  Positive for flank pain. Negative for dysuria.  Musculoskeletal:  Negative for arthralgias and myalgias.  Skin:  Negative for color change, rash and wound.  Neurological:  Negative for dizziness, syncope, weakness, light-headedness and headaches.    Physical Exam Triage Vital Signs ED Triage Vitals  Enc Vitals Group     BP      Pulse      Resp      Temp      Temp src      SpO2      Weight      Height      Head Circumference      Peak Flow      Pain Score      Pain Loc      Pain Edu?      Excl. in GC?    No data found.  Updated Vital Signs BP 123/79 (BP Location: Right Arm)   Pulse 91   Temp 98.1 F (36.7 C) (Oral)  Resp 16   SpO2 96%   Visual Acuity Right Eye Distance:   Left Eye Distance:   Bilateral Distance:    Right Eye Near:   Left Eye Near:    Bilateral Near:     Physical Exam Vitals and nursing note reviewed.  Constitutional:      Appearance: He is well-developed.     Comments: No acute distress  HENT:     Head: Normocephalic and atraumatic.     Nose: Nose normal.     Mouth/Throat:     Comments: Oral mucosa pink and moist, no tonsillar enlargement or exudate. Posterior pharynx patent and nonerythematous, no uvula deviation or swelling. Normal phonation.  Eyes:     Conjunctiva/sclera: Conjunctivae normal.  Cardiovascular:     Rate and Rhythm: Normal rate.  Pulmonary:     Effort: Pulmonary effort is normal. No respiratory distress.     Comments: Breathing comfortably at rest, CTABL, no wheezing, rales or other adventitious sounds auscultated  Abdominal:     General: There is no distension.      Comments: Soft, nondistended, nontender to palpation to epigastrium, right upper and lower quadrant, tenderness to palpation to far left middle abdomen and extending into the left flank, no left lower quadrant tenderness, negative rebound  Musculoskeletal:        General: Normal range of motion.     Cervical back: Neck supple.  Skin:    General: Skin is warm and dry.  Neurological:     Mental Status: He is alert and oriented to person, place, and time.     UC Treatments / Results  Labs (all labs ordered are listed, but only abnormal results are displayed) Labs Reviewed  POCT URINALYSIS DIP (MANUAL ENTRY)    EKG   Radiology No results found.  Procedures Procedures (including critical care time)  Medications Ordered in UC Medications - No data to display  Initial Impression / Assessment and Plan / UC Course  I have reviewed the triage vital signs and the nursing notes.  Pertinent labs & imaging results that were available during my care of the patient were reviewed by me and considered in my medical decision making (see chart for details).     Left flank pain-UA unremarkable, possible MSK etiology versus underlying constipation.  Discussed recommendations with patient and recommended monitoring.  Encouraged use of anti-inflammatories if pain continuing to be with movement.  MiraLAX/Colace combination if developing more constipation or changes with bowel movements/eating.  Lower suspicion of diverticulitis, discussed monitoring pain, if developing more severe to follow-up in emergency room.  Discussed strict return precautions. Patient verbalized understanding and is agreeable with plan.  Final Clinical Impressions(s) / UC Diagnoses   Final diagnoses:  Left flank pain     Discharge Instructions      Please use ibuprofen with food every 8 hours, may pair with Tylenol  Please use Miralax for moderate to severe constipation. Take this (17g) once a day for the next 2-3  days. Please also start docusate stool softener, twice a day for at least 1 week. If stools become loose, cut down to once a day for another week. If stools remain loose, cut back to 1 pill every other day for a third week. You can stop docusate thereafter and resume as needed for constipation.  To help reduce constipation and promote bowel health: 1. Drink at least 64 ounces of water each day 2. Eat plenty of fiber (fruits, vegetables, whole grains, legumes) 3. Be physically  active or exercise including walking, jogging, swimming, yoga, etc. 4. For active constipation use a stool softener (docusate) or an osmotic laxative (like Miralax) each day, or as needed.     ED Prescriptions     Medication Sig Dispense Auth. Provider   ibuprofen (ADVIL) 600 MG tablet Take 1 tablet (600 mg total) by mouth every 6 (six) hours as needed. 30 tablet Mishal Probert, [place] C, PA-C      PDMP not reviewed this encounter.   Lew Dawes, [place] 12/03/20 [phone]

## 2021-07-18 ENCOUNTER — Encounter: Payer: Self-pay | Admitting: Internal Medicine

## 2021-07-18 ENCOUNTER — Ambulatory Visit (INDEPENDENT_AMBULATORY_CARE_PROVIDER_SITE_OTHER): Payer: 59 | Admitting: Internal Medicine

## 2021-07-18 VITALS — BP 120/88 | HR 77 | Temp 98.1°F | Ht 72.0 in | Wt 195.2 lb

## 2021-07-18 DIAGNOSIS — Z114 Encounter for screening for human immunodeficiency virus [HIV]: Secondary | ICD-10-CM

## 2021-07-18 DIAGNOSIS — Z0000 Encounter for general adult medical examination without abnormal findings: Secondary | ICD-10-CM | POA: Diagnosis not present

## 2021-07-18 DIAGNOSIS — E559 Vitamin D deficiency, unspecified: Secondary | ICD-10-CM | POA: Diagnosis not present

## 2021-07-18 DIAGNOSIS — E538 Deficiency of other specified B group vitamins: Secondary | ICD-10-CM

## 2021-07-18 DIAGNOSIS — Z1159 Encounter for screening for other viral diseases: Secondary | ICD-10-CM

## 2021-07-18 LAB — CBC WITH DIFFERENTIAL/PLATELET
Basophils Absolute: 0 10*3/uL (ref 0.0–0.1)
Basophils Relative: 0.8 % (ref 0.0–3.0)
Eosinophils Absolute: 0.1 10*3/uL (ref 0.0–0.7)
Eosinophils Relative: 2.4 % (ref 0.0–5.0)
HCT: 41.9 % (ref 39.0–52.0)
Hemoglobin: 14.2 g/dL (ref 13.0–17.0)
Lymphocytes Relative: 49.9 % — ABNORMAL HIGH (ref 12.0–46.0)
Lymphs Abs: 1.8 10*3/uL (ref 0.7–4.0)
MCHC: 33.9 g/dL (ref 30.0–36.0)
MCV: 93.9 fl (ref 78.0–100.0)
Monocytes Absolute: 0.3 10*3/uL (ref 0.1–1.0)
Monocytes Relative: 8.9 % (ref 3.0–12.0)
Neutro Abs: 1.4 10*3/uL (ref 1.4–7.7)
Neutrophils Relative %: 38 % — ABNORMAL LOW (ref 43.0–77.0)
Platelets: 213 10*3/uL (ref 150.0–400.0)
RBC: 4.46 Mil/uL (ref 4.22–5.81)
RDW: 12.8 % (ref 11.5–15.5)
WBC: 3.6 10*3/uL — ABNORMAL LOW (ref 4.0–10.5)

## 2021-07-18 LAB — COMPREHENSIVE METABOLIC PANEL
ALT: 22 U/L (ref 0–53)
AST: 21 U/L (ref 0–37)
Albumin: 4.7 g/dL (ref 3.5–5.2)
Alkaline Phosphatase: 66 U/L (ref 39–117)
BUN: 13 mg/dL (ref 6–23)
CO2: 30 mEq/L (ref 19–32)
Calcium: 9.7 mg/dL (ref 8.4–10.5)
Chloride: 105 mEq/L (ref 96–112)
Creatinine, Ser: 1.14 mg/dL (ref 0.40–1.50)
GFR: 79.79 mL/min (ref >60.00)
Glucose, Bld: 86 mg/dL (ref 70–99)
Potassium: 4.7 mEq/L (ref 3.5–5.1)
Sodium: 140 mEq/L (ref 135–145)
Total Bilirubin: 0.5 mg/dL (ref 0.2–1.2)
Total Protein: 7.3 g/dL (ref 6.0–8.3)

## 2021-07-18 LAB — HEMOGLOBIN A1C: Hgb A1c MFr Bld: 5.5 % (ref 4.6–6.5)

## 2021-07-18 LAB — LIPID PANEL
Cholesterol: 196 mg/dL (ref 0–200)
HDL: 45.6 mg/dL (ref >39.00)
LDL Cholesterol: 126 mg/dL — ABNORMAL HIGH (ref 0–99)
NonHDL: 150.08
Total CHOL/HDL Ratio: 4
Triglycerides: 118 mg/dL (ref 0.0–149.0)
VLDL: 23.6 mg/dL (ref 0.0–40.0)

## 2021-07-18 NOTE — Patient Instructions (Signed)
-  Nice seeing you today!! ? ?-Lab work today; will notify you once results are available. One time HIV and Hep C screening will be done as well. ? ?-Consider you flu and COVID boosters. ? ?-Schedule follow up in 1 year or sooner as needed. ? ? ?

## 2021-07-18 NOTE — Progress Notes (Signed)
? ? ? ?Established Patient Office Visit ? ? ? ? ?This visit occurred during the SARS-CoV-2 public health emergency.  Safety protocols were in place, including screening questions prior to the visit, additional usage of staff PPE, and extensive cleaning of exam room while observing appropriate contact time as indicated for disinfecting solutions.  ? ? ?CC/Reason for Visit: Annual preventive exam ? ?HPI: Tejas Seawood is a 41 y.o. male who is coming in today for the above mentioned reasons. Past Medical History is significant for: Vitamin D and B12 deficiency.  He is doing well and has no acute concerns or complaints.  He is adopted and as such does not know his family history.  He has routine eye and dental care.  He is overdue for flu and COVID boosters. ? ? ?Past Medical/Surgical History: ?Past Medical History:  ?Diagnosis Date  ? Vitamin B12 deficiency   ? Vitamin D deficiency   ? ? ?No past surgical history on file. ? ?Social History: ? reports that he has quit smoking. His smoking use included cigars. He has never used smokeless tobacco. He reports current alcohol use. He reports that he does not currently use drugs after having used the following drugs: Marijuana. ? ?Allergies: ?No Known Allergies ? ?Family History:  ?Family History  ?Adopted: Yes  ? ? ? ?Current Outpatient Medications:  ?  ibuprofen (ADVIL) 600 MG tablet, Take 1 tablet (600 mg total) by mouth every 6 (six) hours as needed., Disp: 30 tablet, Rfl: 0 ? ?Review of Systems:  ?Constitutional: Denies fever, chills, diaphoresis, appetite change and fatigue.  ?HEENT: Denies photophobia, eye pain, redness, hearing loss, ear pain, congestion, sore throat, rhinorrhea, sneezing, mouth sores, trouble swallowing, neck pain, neck stiffness and tinnitus.   ?Respiratory: Denies SOB, DOE, cough, chest tightness,  and wheezing.   ?Cardiovascular: Denies chest pain, palpitations and leg swelling.  ?Gastrointestinal: Denies nausea, vomiting, abdominal  pain, diarrhea, constipation, blood in stool and abdominal distention.  ?Genitourinary: Denies dysuria, urgency, frequency, hematuria, flank pain and difficulty urinating.  ?Endocrine: Denies: hot or cold intolerance, sweats, changes in hair or nails, polyuria, polydipsia. ?Musculoskeletal: Denies myalgias, back pain, joint swelling, arthralgias and gait problem.  ?Skin: Denies pallor, rash and wound.  ?Neurological: Denies dizziness, seizures, syncope, weakness, light-headedness, numbness and headaches.  ?Hematological: Denies adenopathy. Easy bruising, personal or family bleeding history  ?Psychiatric/Behavioral: Denies suicidal ideation, mood changes, confusion, nervousness, sleep disturbance and agitation ? ? ? ?Physical Exam: ?Vitals:  ? 07/18/21 0805  ?BP: 120/88  ?Pulse: 77  ?Temp: 98.1 ?F (36.7 ?C)  ?TempSrc: Oral  ?SpO2: 99%  ?Weight: 195 lb 3.2 oz (88.5 kg)  ?Height: 6' (1.829 m)  ? ? ?Body mass index is 26.47 kg/m?. ? ? ?Constitutional: NAD, calm, comfortable ?Eyes: PERRL, lids and conjunctivae normal ?ENMT: Mucous membranes are moist. Posterior pharynx clear of any exudate or lesions. Normal dentition. Tympanic membrane is pearly white, no erythema or bulging. ?Neck: normal, supple, no masses, no thyromegaly ?Respiratory: clear to auscultation bilaterally, no wheezing, no crackles. Normal respiratory effort. No accessory muscle use.  ?Cardiovascular: Regular rate and rhythm, no murmurs / rubs / gallops. No extremity edema. 2+ pedal pulses. No carotid bruits.  ?Abdomen: no tenderness, no masses palpated. No hepatosplenomegaly. Bowel sounds positive.  ?Musculoskeletal: no clubbing / cyanosis. No joint deformity upper and lower extremities. Good ROM, no contractures. Normal muscle tone.  ?Skin: no rashes, lesions, ulcers. No induration ?Neurologic: CN 2-12 grossly intact. Sensation intact, DTR normal. Strength 5/5 in all 4.  ?  Psychiatric: Normal judgment and insight. Alert and oriented x 3. Normal mood.   ? ? ?Impression and Plan: ? ?Encounter for preventive health examination ? - Plan: CBC with Differential/Platelet, Comprehensive metabolic panel, Hemoglobin A1c, Lipid panel, PSA, PSA, Lipid panel, Hemoglobin A1c, Comprehensive metabolic panel, CBC with Differential/Platelet ?-Recommend routine eye and dental care. ?-Immunizations: He declines flu and COVID booster despite counseling, other immunizations are up-to-date ?-Healthy lifestyle discussed in detail. ?-Labs to be updated today. ?-Colon cancer screening: Commence age 65 ?-Breast cancer screening: Not applicable ?-Cervical cancer screening: Not applicable ?-Lung cancer screening: Not applicable, never smoker ?-Prostate cancer screening: PSA today ?-DEXA: Not applicable ? ?Vitamin D deficiency ? - Plan: VITAMIN D 25 Hydroxy (Vit-D Deficiency, Fractures), VITAMIN D 25 Hydroxy (Vit-D Deficiency, Fractures) ? ?Vitamin B12 deficiency  ?- Plan: Vitamin B12, Vitamin B12 ? ?Encounter for screening for HIV  ?- Plan: HIV antibody (with reflex) ? ?Encounter for hepatitis C screening test for low risk patient  ?- Plan: Hep C Antibody ? ? ? ?Patient Instructions  ?-Nice seeing you today!! ? ?-Lab work today; will notify you once results are available. One time HIV and Hep C screening will be done as well. ? ?-Consider you flu and COVID boosters. ? ?-Schedule follow up in 1 year or sooner as needed. ? ? ? ? ? ?Chaya Jan, MD ?[facility] ? ? ?

## 2021-07-19 ENCOUNTER — Encounter: Payer: Self-pay | Admitting: Internal Medicine

## 2021-07-19 ENCOUNTER — Other Ambulatory Visit: Payer: Self-pay | Admitting: Internal Medicine

## 2021-07-19 DIAGNOSIS — E559 Vitamin D deficiency, unspecified: Secondary | ICD-10-CM

## 2021-07-19 DIAGNOSIS — E785 Hyperlipidemia, unspecified: Secondary | ICD-10-CM

## 2021-07-19 DIAGNOSIS — E538 Deficiency of other specified B group vitamins: Secondary | ICD-10-CM

## 2021-07-19 LAB — HIV ANTIBODY (ROUTINE TESTING W REFLEX): HIV 1&2 Ab, 4th Generation: NONREACTIVE

## 2021-07-19 LAB — VITAMIN B12: Vitamin B-12: 215 pg/mL (ref 211–911)

## 2021-07-19 LAB — HEPATITIS C ANTIBODY
Hepatitis C Ab: NONREACTIVE
SIGNAL TO CUT-OFF: 0.05 (ref <1.00)

## 2021-07-19 LAB — PSA: PSA: 0.44 ng/mL (ref 0.10–4.00)

## 2021-07-19 LAB — VITAMIN D 25 HYDROXY (VIT D DEFICIENCY, FRACTURES): VITD: 28.04 ng/mL — ABNORMAL LOW (ref 30.00–100.00)

## 2021-07-19 MED ORDER — VITAMIN D (ERGOCALCIFEROL) 1.25 MG (50000 UNIT) PO CAPS: 50000.0000 [IU] | ORAL_CAPSULE | ORAL | 0 refills | Status: AC

## 2021-07-19 MED ORDER — "BD SAFETYGLIDE SYRINGE/NEEDLE 25G X 1"" 3 ML MISC": 11 refills | Status: AC

## 2021-07-19 MED ORDER — CYANOCOBALAMIN 1000 MCG/ML IJ SOLN: 1000.0000 ug | INTRAMUSCULAR | 11 refills | Status: AC

## 2021-08-11 ENCOUNTER — Encounter: Payer: Self-pay | Admitting: Internal Medicine

## 2021-08-17 ENCOUNTER — Ambulatory Visit: Payer: 59

## 2021-10-05 ENCOUNTER — Other Ambulatory Visit: Payer: Self-pay | Admitting: Internal Medicine

## 2021-10-05 DIAGNOSIS — E559 Vitamin D deficiency, unspecified: Secondary | ICD-10-CM

## 2022-05-22 ENCOUNTER — Ambulatory Visit: Payer: 59 | Admitting: Internal Medicine

## 2022-07-26 ENCOUNTER — Ambulatory Visit (INDEPENDENT_AMBULATORY_CARE_PROVIDER_SITE_OTHER): Payer: 59 | Admitting: Internal Medicine

## 2022-07-26 ENCOUNTER — Encounter: Payer: Self-pay | Admitting: Internal Medicine

## 2022-07-26 VITALS — BP 120/80 | HR 60 | Temp 98.2°F | Ht 71.5 in | Wt 188.8 lb

## 2022-07-26 DIAGNOSIS — Z23 Encounter for immunization: Secondary | ICD-10-CM

## 2022-07-26 DIAGNOSIS — Z0000 Encounter for general adult medical examination without abnormal findings: Secondary | ICD-10-CM | POA: Diagnosis not present

## 2022-07-26 DIAGNOSIS — E559 Vitamin D deficiency, unspecified: Secondary | ICD-10-CM | POA: Diagnosis not present

## 2022-07-26 DIAGNOSIS — E785 Hyperlipidemia, unspecified: Secondary | ICD-10-CM

## 2022-07-26 DIAGNOSIS — E538 Deficiency of other specified B group vitamins: Secondary | ICD-10-CM | POA: Diagnosis not present

## 2022-07-26 DIAGNOSIS — Z125 Encounter for screening for malignant neoplasm of prostate: Secondary | ICD-10-CM

## 2022-07-26 LAB — CBC WITH DIFFERENTIAL/PLATELET
Basophils Absolute: 0 10*3/uL (ref 0.0–0.1)
Basophils Relative: 0.6 % (ref 0.0–3.0)
Eosinophils Absolute: 0.1 10*3/uL (ref 0.0–0.7)
Eosinophils Relative: 1.1 % (ref 0.0–5.0)
HCT: 40.2 % (ref 39.0–52.0)
Hemoglobin: 13.5 g/dL (ref 13.0–17.0)
Lymphocytes Relative: 44.7 % (ref 12.0–46.0)
Lymphs Abs: 2.5 10*3/uL (ref 0.7–4.0)
MCHC: 33.6 g/dL (ref 30.0–36.0)
MCV: 93.5 fl (ref 78.0–100.0)
Monocytes Absolute: 0.5 10*3/uL (ref 0.1–1.0)
Monocytes Relative: 8.1 % (ref 3.0–12.0)
Neutro Abs: 2.6 10*3/uL (ref 1.4–7.7)
Neutrophils Relative %: 45.5 % (ref 43.0–77.0)
Platelets: 241 10*3/uL (ref 150.0–400.0)
RBC: 4.29 Mil/uL (ref 4.22–5.81)
RDW: 12.7 % (ref 11.5–15.5)
WBC: 5.6 10*3/uL (ref 4.0–10.5)

## 2022-07-26 LAB — LIPID PANEL
Cholesterol: 174 mg/dL (ref 0–200)
HDL: 44.6 mg/dL (ref >39.00)
LDL Cholesterol: 103 mg/dL — ABNORMAL HIGH (ref 0–99)
NonHDL: 128.97
Total CHOL/HDL Ratio: 4
Triglycerides: 131 mg/dL (ref 0.0–149.0)
VLDL: 26.2 mg/dL (ref 0.0–40.0)

## 2022-07-26 LAB — PSA: PSA: 0.45 ng/mL (ref 0.10–4.00)

## 2022-07-26 LAB — COMPREHENSIVE METABOLIC PANEL
ALT: 21 U/L (ref 0–53)
AST: 20 U/L (ref 0–37)
Albumin: 4.5 g/dL (ref 3.5–5.2)
Alkaline Phosphatase: 56 U/L (ref 39–117)
BUN: 11 mg/dL (ref 6–23)
CO2: 31 mEq/L (ref 19–32)
Calcium: 9.4 mg/dL (ref 8.4–10.5)
Chloride: 103 mEq/L (ref 96–112)
Creatinine, Ser: 1.1 mg/dL (ref 0.40–1.50)
GFR: 82.69 mL/min (ref >60.00)
Glucose, Bld: 78 mg/dL (ref 70–99)
Potassium: 4.1 mEq/L (ref 3.5–5.1)
Sodium: 138 mEq/L (ref 135–145)
Total Bilirubin: 0.9 mg/dL (ref 0.2–1.2)
Total Protein: 6.8 g/dL (ref 6.0–8.3)

## 2022-07-26 LAB — VITAMIN D 25 HYDROXY (VIT D DEFICIENCY, FRACTURES): VITD: 32.37 ng/mL (ref 30.00–100.00)

## 2022-07-26 LAB — VITAMIN B12: Vitamin B-12: 315 pg/mL (ref 211–911)

## 2022-07-26 NOTE — Progress Notes (Signed)
Established Patient Office Visit     CC/Reason for Visit: Annual preventive exam  HPI: Caleb Trujillo is a 42 y.o. male who is coming in today for the above mentioned reasons. Past Medical History is significant for: Vitamin D and B12 deficiency.  He has been doing well and has no acute concerns or complaints.  Has routine eye and dental care.  He is due for Tdap.   Past Medical/Surgical History: Past Medical History:  Diagnosis Date   Vitamin B12 deficiency    Vitamin D deficiency     No past surgical history on file.  Social History:  reports that he has quit smoking. His smoking use included cigars. He has never used smokeless tobacco. He reports current alcohol use. He reports that he does not currently use drugs after having used the following drugs: Marijuana.  Allergies: No Known Allergies  Family History:  Family History  Adopted: Yes     Current Outpatient Medications:    ibuprofen (ADVIL) 600 MG tablet, Take 1 tablet (600 mg total) by mouth every 6 (six) hours as needed., Disp: 30 tablet, Rfl: 0   SYRINGE-NEEDLE, DISP, 3 ML (BD SAFETYGLIDE SYRINGE/NEEDLE) 25G X 1" 3 ML MISC, Use for b12 injections, Disp: 100 each, Rfl: 11   cyanocobalamin (,VITAMIN B-12,) 1000 MCG/ML injection, Inject 1 mL (1,000 mcg total) into the skin every 30 (thirty) days. (Patient not taking: Reported on 07/26/2022), Disp: 6 mL, Rfl: 11  Review of Systems:  Negative unless indicated in HPI.   Physical Exam: Vitals:   07/26/22 1100  BP: 120/80  Pulse: 60  Temp: 98.2 F (36.8 C)  TempSrc: Oral  SpO2: 99%  Weight: 188 lb 12.8 oz (85.6 kg)  Height: 5' 11.5" (1.816 m)    Body mass index is 25.97 kg/m.   Physical Exam Vitals reviewed.  Constitutional:      General: He is not in acute distress.    Appearance: Normal appearance. He is not ill-appearing, toxic-appearing or diaphoretic.  HENT:     Head: Normocephalic.     Right Ear: Tympanic membrane, ear canal and  external ear normal. There is no impacted cerumen.     Left Ear: Tympanic membrane, ear canal and external ear normal. There is no impacted cerumen.     Nose: Nose normal.     Mouth/Throat:     Mouth: Mucous membranes are moist.     Pharynx: Oropharynx is clear. No oropharyngeal exudate or posterior oropharyngeal erythema.  Eyes:     General: No scleral icterus.       Right eye: No discharge.        Left eye: No discharge.     Conjunctiva/sclera: Conjunctivae normal.     Pupils: Pupils are equal, round, and reactive to light.  Neck:     Vascular: No carotid bruit.  Cardiovascular:     Rate and Rhythm: Normal rate and regular rhythm.     Pulses: Normal pulses.     Heart sounds: Normal heart sounds.  Pulmonary:     Effort: Pulmonary effort is normal. No respiratory distress.     Breath sounds: Normal breath sounds.  Abdominal:     General: Abdomen is flat. Bowel sounds are normal.     Palpations: Abdomen is soft.  Musculoskeletal:        General: Normal range of motion.     Cervical back: Normal range of motion.  Skin:    General: Skin is warm and dry.  Neurological:     General: No focal deficit present.     Mental Status: He is alert and oriented to person, place, and time. Mental status is at baseline.  Psychiatric:        Mood and Affect: Mood normal.        Behavior: Behavior normal.        Thought Content: Thought content normal.        Judgment: Judgment normal.      Impression and Plan:  Encounter for preventive health examination  Vitamin D deficiency - Plan: Vitamin D, 25-hydroxy  Vitamin B12 deficiency - Plan: Vitamin B12  Hyperlipidemia, unspecified hyperlipidemia type - Plan: CBC with Differential/Platelet, Comprehensive metabolic panel, Lipid panel  Prostate cancer screening - Plan: PSA  Immunization due  -Recommend routine eye and dental care. -Healthy lifestyle discussed in detail. -Labs to be updated today. -Prostate cancer screening: PSA  today Health Maintenance  Topic Date Due   COVID-19 Vaccine (3 - 2023-24 season) 08/11/2022*   Flu Shot  11/23/2022   DTaP/Tdap/Td vaccine (2 - Tdap) 02/11/2023   Hepatitis C Screening: USPSTF Recommendation to screen - Ages 18-79 yo.  Completed   HIV Screening  Completed   HPV Vaccine  Aged Out  *Topic was postponed. The date shown is not the original due date.    -Tdap today.     Lelon Frohlich, MD [facility]

## 2023-12-20 ENCOUNTER — Encounter: Payer: Self-pay | Admitting: Internal Medicine
